# Patient Record
Sex: Male | Born: 1985 | Race: Black or African American | Hispanic: No | Marital: Single | Smoking: Never smoker
Health system: Southern US, Community
[De-identification: ages and names within clinical notes are randomized; demographics above are authoritative.]

## PROBLEM LIST (undated history)

## (undated) DIAGNOSIS — G43909 Migraine, unspecified, not intractable, without status migrainosus: Secondary | ICD-10-CM

## (undated) DIAGNOSIS — F32A Depression, unspecified: Secondary | ICD-10-CM

## (undated) HISTORY — DX: Depression, unspecified: F32.A

---

## 2012-08-15 DIAGNOSIS — L663 Perifolliculitis capitis abscedens: Secondary | ICD-10-CM | POA: Insufficient documentation

## 2015-09-23 ENCOUNTER — Other Ambulatory Visit: Payer: Self-pay | Admitting: Family Medicine

## 2015-09-23 ENCOUNTER — Ambulatory Visit
Admission: RE | Admit: 2015-09-23 | Discharge: 2015-09-23 | Disposition: A | Discharge status: Home / Self Care | Payer: No Typology Code available for payment source | Source: Ambulatory Visit | Attending: Family Medicine | Admitting: Family Medicine

## 2015-09-23 DIAGNOSIS — Z021 Encounter for pre-employment examination: Secondary | ICD-10-CM

## 2017-10-19 ENCOUNTER — Emergency Department (HOSPITAL_BASED_OUTPATIENT_CLINIC_OR_DEPARTMENT_OTHER): Payer: 59

## 2017-10-19 ENCOUNTER — Other Ambulatory Visit: Payer: Self-pay

## 2017-10-19 ENCOUNTER — Emergency Department (HOSPITAL_BASED_OUTPATIENT_CLINIC_OR_DEPARTMENT_OTHER)
Admission: EM | Admit: 2017-10-19 | Discharge: 2017-10-19 | Disposition: A | Discharge status: Home / Self Care | Payer: 59 | Attending: Emergency Medicine | Admitting: Emergency Medicine

## 2017-10-19 ENCOUNTER — Encounter (HOSPITAL_BASED_OUTPATIENT_CLINIC_OR_DEPARTMENT_OTHER): Payer: Self-pay | Admitting: *Deleted

## 2017-10-19 DIAGNOSIS — I10 Essential (primary) hypertension: Secondary | ICD-10-CM | POA: Insufficient documentation

## 2017-10-19 DIAGNOSIS — M25512 Pain in left shoulder: Secondary | ICD-10-CM | POA: Diagnosis not present

## 2017-10-19 DIAGNOSIS — R202 Paresthesia of skin: Secondary | ICD-10-CM | POA: Insufficient documentation

## 2017-10-19 DIAGNOSIS — R5383 Other fatigue: Secondary | ICD-10-CM | POA: Diagnosis not present

## 2017-10-19 DIAGNOSIS — M542 Cervicalgia: Secondary | ICD-10-CM | POA: Insufficient documentation

## 2017-10-19 DIAGNOSIS — R079 Chest pain, unspecified: Secondary | ICD-10-CM | POA: Diagnosis present

## 2017-10-19 HISTORY — DX: Migraine, unspecified, not intractable, without status migrainosus: G43.909

## 2017-10-19 LAB — CBC WITH DIFFERENTIAL/PLATELET
BASOS PCT: 0 %
Basophils Absolute: 0 10*3/uL (ref 0.0–0.1)
Eosinophils Absolute: 0.1 10*3/uL (ref 0.0–0.7)
Eosinophils Relative: 1 %
HEMATOCRIT: 42.1 % (ref 39.0–52.0)
Hemoglobin: 14.5 g/dL (ref 13.0–17.0)
Lymphocytes Relative: 46 %
Lymphs Abs: 2.9 10*3/uL (ref 0.7–4.0)
MCH: 26.6 pg (ref 26.0–34.0)
MCHC: 34.4 g/dL (ref 30.0–36.0)
MCV: 77.1 fL — ABNORMAL LOW (ref 78.0–100.0)
MONO ABS: 0.6 10*3/uL (ref 0.1–1.0)
MONOS PCT: 9 %
NEUTROS ABS: 2.9 10*3/uL (ref 1.7–7.7)
Neutrophils Relative %: 44 %
Platelets: 249 10*3/uL (ref 150–400)
RBC: 5.46 MIL/uL (ref 4.22–5.81)
RDW: 14.8 % (ref 11.5–15.5)
WBC: 6.5 10*3/uL (ref 4.0–10.5)

## 2017-10-19 LAB — BASIC METABOLIC PANEL
ANION GAP: 8 (ref 5–15)
BUN: 9 mg/dL (ref 6–20)
CALCIUM: 9 mg/dL (ref 8.9–10.3)
CO2: 27 mmol/L (ref 22–32)
CREATININE: 1.01 mg/dL (ref 0.61–1.24)
Chloride: 102 mmol/L (ref 101–111)
GFR calc Af Amer: >60 mL/min (ref >60)
GFR calc non Af Amer: >60 mL/min (ref >60)
GLUCOSE: 90 mg/dL (ref 65–99)
Potassium: 3.7 mmol/L (ref 3.5–5.1)
Sodium: 137 mmol/L (ref 135–145)

## 2017-10-19 LAB — TROPONIN I: Troponin I: <0.03 ng/mL (ref <0.03)

## 2017-10-19 MED ORDER — IBUPROFEN 600 MG PO TABS: 600.0000 mg | ORAL_TABLET | Freq: Four times a day (QID) | ORAL | 0 refills | Status: DC | PRN

## 2017-10-19 MED ORDER — KETOROLAC TROMETHAMINE 30 MG/ML IJ SOLN
30.0000 mg | Freq: Once | INTRAMUSCULAR | Status: AC
  Administered 2017-10-19: 30 mg via INTRAVENOUS
  Filled 2017-10-19: qty 1

## 2017-10-19 MED ORDER — GI COCKTAIL ~~LOC~~
30.0000 mL | Freq: Once | ORAL | Status: AC
  Administered 2017-10-19: 30 mL via ORAL
  Filled 2017-10-19: qty 30

## 2017-10-19 NOTE — Discharge Instructions (Signed)
Your workup in the emergency department today did not reveal a concerning cause of your symptoms.  We advise close follow-up with your primary care doctor for recheck of your symptoms as well as reevaluation of your blood pressure.  We recommend the use of ibuprofen as prescribed for ongoing symptom management.  Return to the emergency department as needed for new or concerning symptoms.

## 2017-10-19 NOTE — ED Triage Notes (Signed)
Attempted to call the patient to get EKG  - patient is in the waiting room

## 2017-10-19 NOTE — ED Triage Notes (Signed)
This morning pt woke up with pain in his back. Pain went into shoulders. Now pain is in the center of chest and having tingling in left side of face and having a upset stomach.

## 2017-10-19 NOTE — ED Provider Notes (Signed)
[facility] EMERGENCY DEPARTMENT Provider Note   CSN: [id number] Arrival date & time: 10/19/17  1813    History   Chief Complaint Chief Complaint  Patient presents with  . Chest Pain    HPI Matthew Daniels is a 32 y.o. male.   32 year old male with a history of migraine headaches presents to the emergency department for evaluation of chest pain.  He states that he noticed a soreness in his central chest shortly after waking this morning.  He has noted radiation of his pain to the left side of his neck as well as his left shoulder.  This is aggravated with certain movements, not necessarily exacerbated by ambulation.  He has noted a mild tingling sensation to the left side of his face as well as increasing generalized fatigue.  He states that his stomach felt "a little funny" prior to coming in today.  He has not had any vomiting or diarrhea, but does have a history of reflux.  No associated fevers, diaphoresis, leg swelling.  He denies a known history of hypertension.  He denies a smoking history or family history of ACS.  No recent surgeries or hospitalizations.  No medications taken prior to arrival for symptoms.      Past Medical History:  Diagnosis Date  . Migraine     There are no active problems to display for this patient.   History reviewed. No pertinent surgical history.     Home Medications    Prior to Admission medications   Medication Sig Start Date End Date Taking? Authorizing Provider  SUMAtriptan (IMITREX) 50 MG tablet Take 50 mg by mouth every 2 (two) hours as needed for migraine. May repeat in 2 hours if headache persists or recurs.   Yes [provider]  ibuprofen (ADVIL,MOTRIN) 600 MG tablet Take 1 tablet (600 mg total) by mouth every 6 (six) hours as needed. 10/19/17   Antony Madura, PA-C    Family History History reviewed. No pertinent family history.  Social History Social History   Tobacco Use  . Smoking status: Never Smoker    . Smokeless tobacco: Never Used  Substance Use Topics  . Alcohol use: No    Frequency: Never  . Drug use: No     Allergies   Patient has no known allergies.   Review of Systems Review of Systems Ten systems reviewed and are negative for acute change, except as noted in the HPI.    Physical Exam Updated Vital Signs BP (!) 157/109   Pulse 72   Temp 98.4 F (36.9 C) (Oral)   Resp 18   Ht 6\' 3"  (1.905 m)   Wt 122.5 kg (270 lb)   SpO2 98%   BMI 33.75 kg/m   Physical Exam  Constitutional: He is oriented to person, place, and time. He appears well-developed and well-nourished. No distress.  Nontoxic appearing and in NAD  HENT:  Head: Normocephalic and atraumatic.  Eyes: Conjunctivae and EOM are normal. No scleral icterus.  Neck: Normal range of motion.  No carotid bruits appreciated bilaterally  Cardiovascular: Normal rate, regular rhythm and intact distal pulses.  Pulmonary/Chest: Effort normal. No stridor. No respiratory distress. He has no wheezes. He has no rales.  Lungs CTAB. No reproducible TTP to chest wall.  Abdominal: Soft. He exhibits no distension. There is no tenderness. There is no guarding.  No abdominal TTP. Soft, obese.  Musculoskeletal: Normal range of motion.  No lower extremity edema.  Neurological: He is alert and oriented  to person, place, and time.  Skin: Skin is warm and dry. No rash noted. He is not diaphoretic. No erythema. No pallor.  Psychiatric: He has a normal mood and affect. His behavior is normal.  Nursing note and vitals reviewed.    ED Treatments / Results  Labs (all labs ordered are listed, but only abnormal results are displayed) Labs Reviewed  CBC WITH DIFFERENTIAL/PLATELET - Abnormal; Notable for the following components:      Result Value   MCV 77.1 (*)    All other components within normal limits  TROPONIN I  BASIC METABOLIC PANEL    EKG  EKG Interpretation  Date/Time:  Friday October 19 2017 18:22:30 EDT Ventricular  Rate:  76 PR Interval:    QRS Duration: 88 QT Interval:  379 QTC Calculation: 427 R Axis:   53 Text Interpretation:  Sinus rhythm Baseline wander in lead(s) II III aVR aVL aVF V1 V3 V4 V5 V6 No previous tracing Confirmed by Gwyneth SproutPlunkett, Whitney ([id number]) on 10/19/2017 6:30:06 PM       Radiology No results found.  Procedures Procedures (including critical care time)  Medications Ordered in ED Medications  gi cocktail (Maalox,Lidocaine,Donnatal) (30 mLs Oral Given 10/19/17 1858)  ketorolac (TORADOL) 30 MG/ML injection 30 mg (30 mg Intravenous Given 10/19/17 1936)     Initial Impression / Assessment and Plan / ED Course  I have reviewed the triage vital signs and the nursing notes.  Pertinent labs & imaging results that were available during my care of the patient were reviewed by me and considered in my medical decision making (see chart for details).     6:50 PM Patient presenting for complaints of chest pain.  Pain has started to radiate to his left shoulder as well as the left side of his jaw and face.  It is worse with movement, especially when leaning forward.  No associated fevers, but he does report recent cold-like symptoms.  No medications taken prior to arrival.  Physical exam reassuring.  Will obtain labs and chest x-ray.  7:32 PM Patient reports improvement in his stomach discomfort following GI cocktail.  He continues to notice a central chest discomfort.  He continues to report this becoming worse when leaning forward or with any movement.  This seems less characteristic of pericarditis and EKG does not suggest pericarditis at this time.  Troponin is negative and EKG without signs of acute ischemia.  Doubt ACS or myocarditis.  Heart score is 1-2 depending upon level of suspicion; both c/w low risk of acute coronary event.  Pulmonary embolus considered; however, patient is PERC negative and symptoms atypical for PE.  Will order Toradol for pain management.  Pending chest x-ray  currently.  MSK etiology favored at this time.  7:53 PM BP noted to be elevated to 152/113 at OP visit on 10/16/17. Also at 2 prior ED visits this month. Will have BP rechecked by PCP prior to initiation of medication. Anticipate discharge on 600mg  ibuprofen every 6 hours with instruction for close primary care follow up if CXR reassuring. Patient signed out to [facility] [employer] [facility] Khatri, PA-C at change of shift who will follow up on imaging.   Final Clinical Impressions(s) / ED Diagnoses   Final diagnoses:  Chest pain, unspecified type  Hypertension not at goal    ED Discharge Orders        Ordered    ibuprofen (ADVIL,MOTRIN) 600 MG tablet  Every 6 hours PRN     10/19/17 1956  Antony Madura, PA-C 10/19/17 2007    Gwyneth Sprout, MD 10/21/17 2016

## 2017-10-19 NOTE — ED Notes (Signed)
Pt verbalizes understanding of d/c instructions and denies any further needs at this time. 

## 2017-10-26 IMAGING — CR DG CHEST 1V
1 series · 1 of 1 positions shown · non-contrast
Comparison: None in PACs.

CLINICAL DATA: Pre-employment physical, asymptomatic, nonsmoker.

EXAM:
CHEST 1 VIEW

[w chest pa]
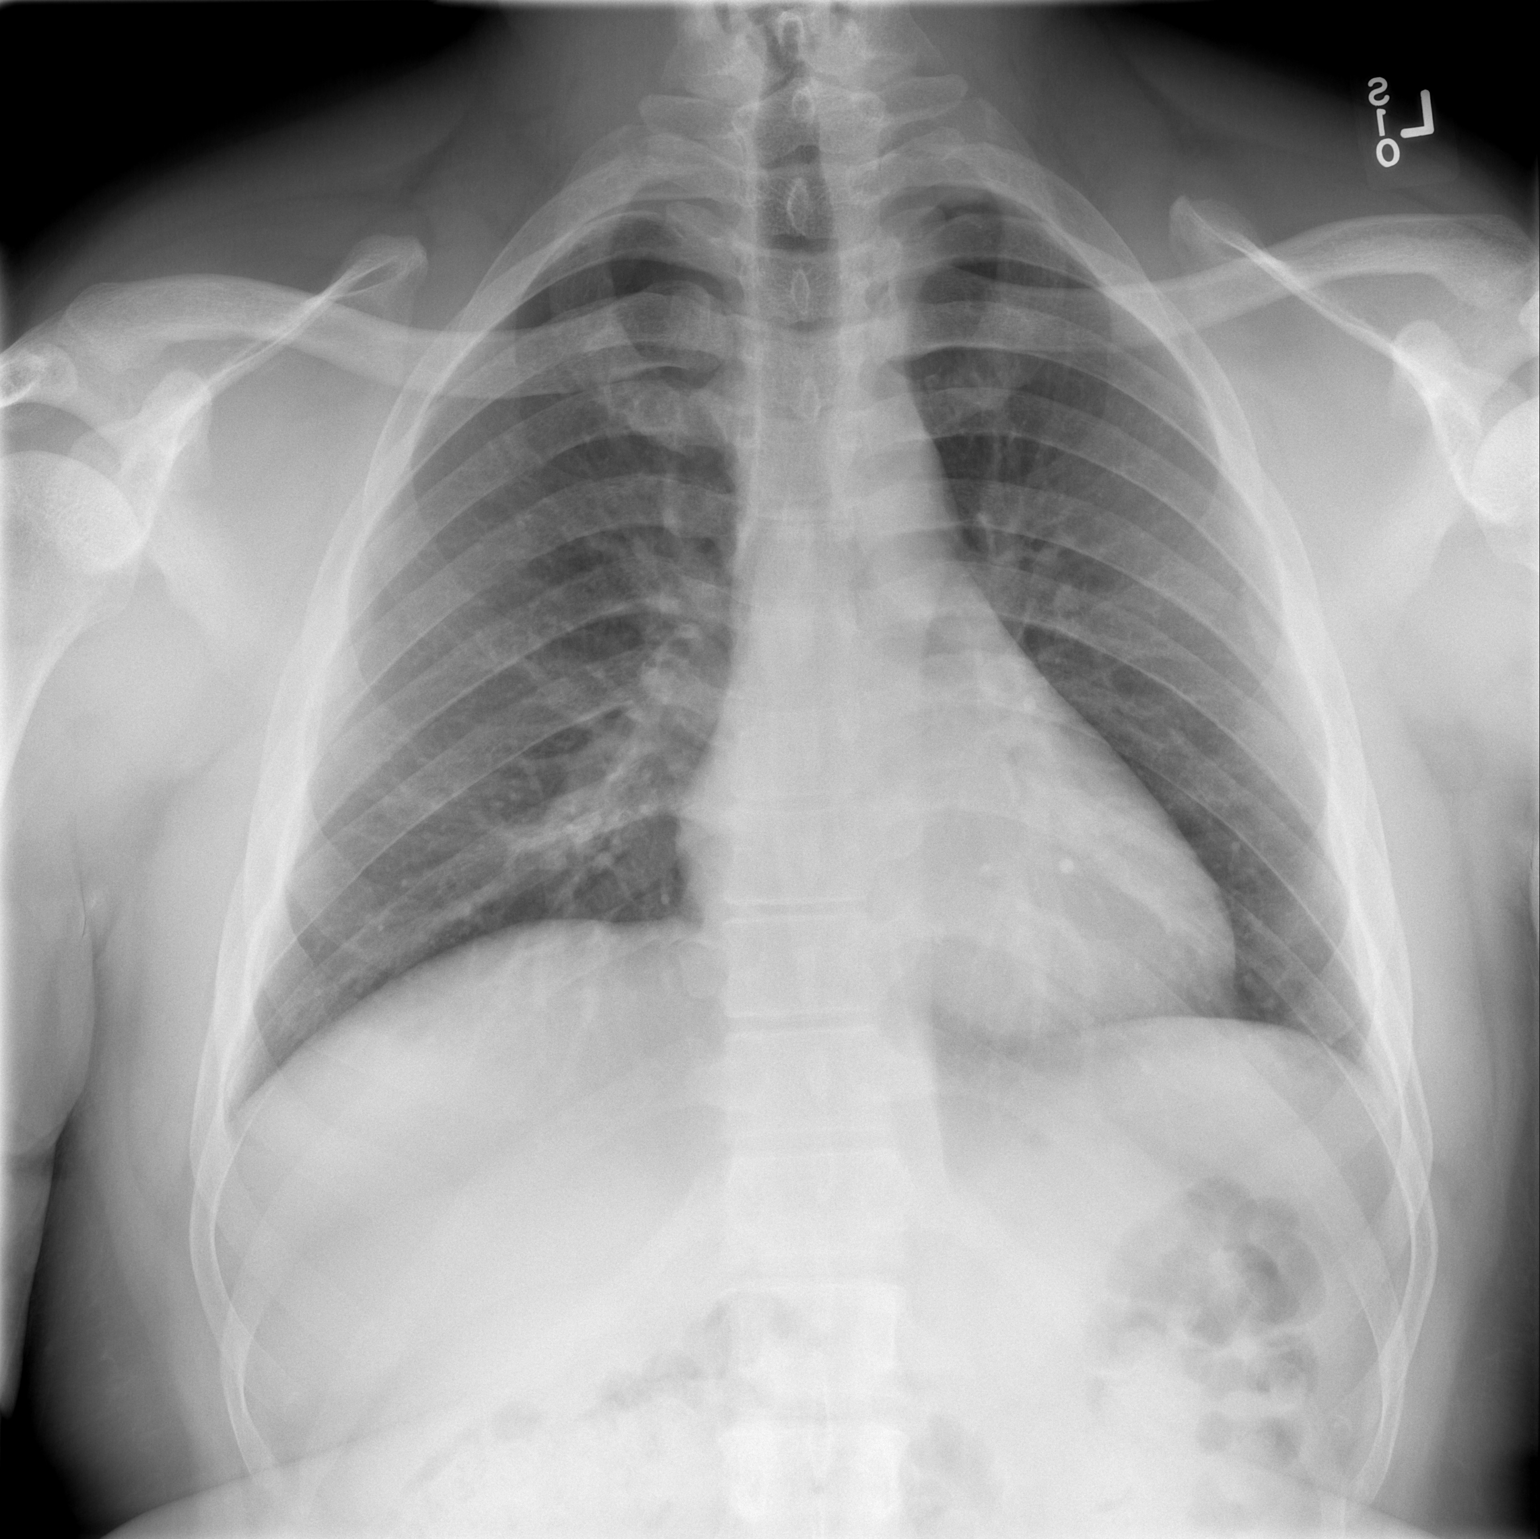

[1 of 1 positions shown; findings below may reference images not displayed]

FINDINGS: The lungs are adequately inflated. There is no focal infiltrate.
There is no pleural effusion. The heart and pulmonary vascularity
are normal. The mediastinum is normal in width. The trachea is
midline. The bony thorax is unremarkable.
IMPRESSION: There is no active cardiopulmonary disease.

## 2017-10-31 DIAGNOSIS — F321 Major depressive disorder, single episode, moderate: Secondary | ICD-10-CM | POA: Insufficient documentation

## 2017-10-31 DIAGNOSIS — G43709 Chronic migraine without aura, not intractable, without status migrainosus: Secondary | ICD-10-CM | POA: Insufficient documentation

## 2019-01-23 ENCOUNTER — Other Ambulatory Visit: Payer: Self-pay

## 2019-01-23 ENCOUNTER — Encounter (HOSPITAL_BASED_OUTPATIENT_CLINIC_OR_DEPARTMENT_OTHER): Payer: Self-pay | Admitting: Emergency Medicine

## 2019-01-23 ENCOUNTER — Emergency Department (HOSPITAL_BASED_OUTPATIENT_CLINIC_OR_DEPARTMENT_OTHER): Payer: Self-pay

## 2019-01-23 ENCOUNTER — Emergency Department (HOSPITAL_BASED_OUTPATIENT_CLINIC_OR_DEPARTMENT_OTHER)
Admission: EM | Admit: 2019-01-23 | Discharge: 2019-01-23 | Disposition: A | Discharge status: Home / Self Care | Payer: Self-pay | Attending: Emergency Medicine | Admitting: Emergency Medicine

## 2019-01-23 DIAGNOSIS — Z20828 Contact with and (suspected) exposure to other viral communicable diseases: Secondary | ICD-10-CM | POA: Insufficient documentation

## 2019-01-23 DIAGNOSIS — R0789 Other chest pain: Secondary | ICD-10-CM | POA: Insufficient documentation

## 2019-01-23 DIAGNOSIS — Z20822 Contact with and (suspected) exposure to covid-19: Secondary | ICD-10-CM

## 2019-01-23 MED ORDER — BENZONATATE 100 MG PO CAPS
100.0000 mg | ORAL_CAPSULE | Freq: Three times a day (TID) | ORAL | 0 refills | Status: DC
Start: 2019-01-23 — End: 2020-02-18

## 2019-01-23 NOTE — ED Provider Notes (Signed)
[facility] EMERGENCY DEPARTMENT Provider Note   CSN: [id number] Arrival date & time: 01/23/19  0758    History   Chief Complaint Chief Complaint  Patient presents with  . Chest Pain    HPI Matthew Daniels is a 33 y.o. male.     The history is provided by the patient and medical records. No language interpreter was used.  Chest Pain  Matthew Daniels is a 33 y.o. male who presents to the Emergency Department complaining of chest pain. He presents to the emergency department complaining of severe chest pain that began about four days ago. Pain is described as a muscle soreness in his throughout his central chest, sides, back and neck. Pain is worse with movement. He states it feels like something is sitting on his chest. He has associated cough productive of yellow sputum. He also has mild intermittent shortness of breath. No reports of fevers but he does feel quite cold at times and thinks he may be running a temperature at home. He has not lost his sense of taste or smell. He denies any leg swelling or pain. He does have some mild diarrhea and nausea. Denies vomiting. He has been exposed to someone that is been exposed to coronavirus a week ago. He has a history of migraine headaches, no additional medical problems. He denies any tobacco, alcohol, drug use. Past Medical History:  Diagnosis Date  . Migraine     There are no active problems to display for this patient.   History reviewed. No pertinent surgical history.      Home Medications    Prior to Admission medications   Medication Sig Start Date End Date Taking? Authorizing Provider  benzonatate (TESSALON) 100 MG capsule Take 1 capsule (100 mg total) by mouth every 8 (eight) hours. 01/23/19   Tilden Fossaees, Tyresha Fede, MD  ibuprofen (ADVIL,MOTRIN) 600 MG tablet Take 1 tablet (600 mg total) by mouth every 6 (six) hours as needed. 10/19/17   Antony MaduraHumes, Kelly, PA-C  SUMAtriptan (IMITREX) 50 MG tablet Take 50 mg by mouth every 2 (two)  hours as needed for migraine. May repeat in 2 hours if headache persists or recurs.    [provider]    Family History No family history on file.  Social History Social History   Tobacco Use  . Smoking status: Never Smoker  . Smokeless tobacco: Never Used  Substance Use Topics  . Alcohol use: No    Frequency: Never  . Drug use: No     Allergies   Patient has no known allergies.   Review of Systems Review of Systems  Cardiovascular: Positive for chest pain.  All other systems reviewed and are negative.    Physical Exam Updated Vital Signs BP (!) 154/111 (BP Location: Right Arm)   Pulse 75   Temp 98 F (36.7 C) (Oral)   Resp 20   Ht 6\' 3"  (1.905 m)   Wt 106.6 kg   SpO2 100%   BMI 29.37 kg/m   Physical Exam Vitals signs and nursing note reviewed.  Constitutional:      Appearance: He is well-developed.  HENT:     Head: Normocephalic and atraumatic.  Cardiovascular:     Rate and Rhythm: Normal rate and regular rhythm.     Heart sounds: No murmur.  Pulmonary:     Effort: Pulmonary effort is normal. No respiratory distress.     Breath sounds: Normal breath sounds.  Chest:     Chest wall: Tenderness present.  Abdominal:     Palpations: Abdomen is soft.     Tenderness: There is no guarding or rebound.     Comments: Mild generalized abdominal tenderness  Musculoskeletal:        General: No swelling or tenderness.  Skin:    General: Skin is warm and dry.  Neurological:     Mental Status: He is alert and oriented to person, place, and time.  Psychiatric:        Mood and Affect: Mood normal.        Behavior: Behavior normal.      ED Treatments / Results  Labs (all labs ordered are listed, but only abnormal results are displayed) Labs Reviewed  NOVEL CORONAVIRUS, NAA (HOSPITAL ORDER, SEND-OUT TO REF LAB)    EKG EKG Interpretation  Date/Time:  Thursday January 23 2019 08:06:07 EDT Ventricular Rate:  76 PR Interval:    QRS Duration: 92  QT Interval:  380 QTC Calculation: 428 R Axis:   50 Text Interpretation:  Sinus rhythm Probable left atrial enlargement Baseline wander in lead(s) V4 V5 No significant change since last tracing Confirmed by Quintella Reichert [phone]) on 01/23/2019 8:09:02 AM   Radiology Dg Chest 2 View  Result Date: 01/23/2019 CLINICAL DATA:  Chest pain, shortness of breath and nausea for 4 days. EXAM: CHEST - 2 VIEW COMPARISON:  Chest x-ray dated 10/19/2017. FINDINGS: Cardiomediastinal silhouette is within normal limits in size and configuration. Lungs are clear. Lung volumes are normal. No evidence of pneumonia. No pleural effusion. No pneumothorax seen. Osseous structures about the chest are unremarkable. IMPRESSION: No active cardiopulmonary disease. No evidence of pneumonia or pulmonary edema. Electronically Signed   By: Franki Cabot M.D.   On: 01/23/2019 08:53    Procedures Procedures (including critical care time)  Medications Ordered in ED Medications - No data to display   Initial Impression / Assessment and Plan / ED Course  I have reviewed the triage vital signs and the nursing notes.  Pertinent labs & imaging results that were available during my care of the patient were reviewed by me and considered in my medical decision making (see chart for details).        Patient here for evaluation of four days of chest pain, shortness of breath and cough. He is non-toxic appearing on evaluation. EKG without acute ischemic changes. Pain is reproducible on examination. Current presentation is not consistent with ACS, dissection, PE, pneumonia. He has had exposure to coronavirus. Discussed with patient concern for possible coronavirus infection. Counseled patient on oral fluid hydration and symptom management with ibuprofen and acetaminophen, over-the-counter per label instructions. Will provide Tessalon pearls for his cough. Discussed importance of return precautions, home isolation.  Matthew Daniels was  evaluated in Emergency Department on 01/23/2019 for the symptoms described in the history of present illness. He was evaluated in the context of the global COVID-19 pandemic, which necessitated consideration that the patient might be at risk for infection with the SARS-CoV-2 virus that causes COVID-19. Institutional protocols and algorithms that pertain to the evaluation of patients at risk for COVID-19 are in a state of rapid change based on information released by regulatory bodies including the CDC and federal and state organizations. These policies and algorithms were followed during the patient's care in the ED.   Final Clinical Impressions(s) / ED Diagnoses   Final diagnoses:  Atypical chest pain  Suspected Covid-19 Virus Infection    ED Discharge Orders         Ordered  benzonatate (TESSALON) 100 MG capsule  Every 8 hours     01/23/19 0910           Tilden Fossaees, Cace Osorto, MD 01/23/19 [phone]

## 2019-01-23 NOTE — ED Notes (Signed)
Pt verbalized understanding of dc instructions.

## 2019-01-23 NOTE — ED Triage Notes (Signed)
Centralized chest pain with SOB x 4 days. Endorses cough.

## 2019-01-24 LAB — NOVEL CORONAVIRUS, NAA (HOSP ORDER, SEND-OUT TO REF LAB; TAT 18-24 HRS): SARS-CoV-2, NAA: NOT DETECTED

## 2019-01-30 ENCOUNTER — Emergency Department (HOSPITAL_BASED_OUTPATIENT_CLINIC_OR_DEPARTMENT_OTHER)
Admission: EM | Admit: 2019-01-30 | Discharge: 2019-01-30 | Disposition: A | Discharge status: Home / Self Care | Payer: Self-pay | Attending: Emergency Medicine | Admitting: Emergency Medicine

## 2019-01-30 ENCOUNTER — Other Ambulatory Visit: Payer: Self-pay

## 2019-01-30 ENCOUNTER — Encounter (HOSPITAL_BASED_OUTPATIENT_CLINIC_OR_DEPARTMENT_OTHER): Payer: Self-pay | Admitting: Emergency Medicine

## 2019-01-30 ENCOUNTER — Emergency Department (HOSPITAL_BASED_OUTPATIENT_CLINIC_OR_DEPARTMENT_OTHER): Payer: Self-pay

## 2019-01-30 DIAGNOSIS — Z20822 Contact with and (suspected) exposure to covid-19: Secondary | ICD-10-CM

## 2019-01-30 DIAGNOSIS — R05 Cough: Secondary | ICD-10-CM | POA: Insufficient documentation

## 2019-01-30 DIAGNOSIS — J069 Acute upper respiratory infection, unspecified: Secondary | ICD-10-CM | POA: Insufficient documentation

## 2019-01-30 LAB — CBC WITH DIFFERENTIAL/PLATELET
Abs Immature Granulocytes: 0.01 10*3/uL (ref 0.00–0.07)
Basophils Absolute: 0 10*3/uL (ref 0.0–0.1)
Basophils Relative: 0 %
Eosinophils Absolute: 0.1 10*3/uL (ref 0.0–0.5)
Eosinophils Relative: 1 %
HCT: 44.6 % (ref 39.0–52.0)
Hemoglobin: 14.2 g/dL (ref 13.0–17.0)
Immature Granulocytes: 0 %
Lymphocytes Relative: 35 %
Lymphs Abs: 2.2 10*3/uL (ref 0.7–4.0)
MCH: 25.9 pg — ABNORMAL LOW (ref 26.0–34.0)
MCHC: 31.8 g/dL (ref 30.0–36.0)
MCV: 81.4 fL (ref 80.0–100.0)
Monocytes Absolute: 0.5 10*3/uL (ref 0.1–1.0)
Monocytes Relative: 8 %
Neutro Abs: 3.6 10*3/uL (ref 1.7–7.7)
Neutrophils Relative %: 56 %
Platelets: 303 10*3/uL (ref 150–400)
RBC: 5.48 MIL/uL (ref 4.22–5.81)
RDW: 14.5 % (ref 11.5–15.5)
WBC: 6.4 10*3/uL (ref 4.0–10.5)
nRBC: 0 % (ref 0.0–0.2)

## 2019-01-30 LAB — COMPREHENSIVE METABOLIC PANEL
ALT: 14 U/L (ref 0–44)
AST: 17 U/L (ref 15–41)
Albumin: 4.3 g/dL (ref 3.5–5.0)
Alkaline Phosphatase: 60 U/L (ref 38–126)
Anion gap: 9 (ref 5–15)
BUN: 10 mg/dL (ref 6–20)
CO2: 27 mmol/L (ref 22–32)
Calcium: 9.1 mg/dL (ref 8.9–10.3)
Chloride: 104 mmol/L (ref 98–111)
Creatinine, Ser: 1.05 mg/dL (ref 0.61–1.24)
GFR calc Af Amer: >60 mL/min (ref >60)
GFR calc non Af Amer: >60 mL/min (ref >60)
Glucose, Bld: 94 mg/dL (ref 70–99)
Potassium: 3.8 mmol/L (ref 3.5–5.1)
Sodium: 140 mmol/L (ref 135–145)
Total Bilirubin: 0.4 mg/dL (ref 0.3–1.2)
Total Protein: 7.8 g/dL (ref 6.5–8.1)

## 2019-01-30 MED ORDER — ACETAMINOPHEN 325 MG PO TABS
650.0000 mg | ORAL_TABLET | Freq: Once | ORAL | Status: AC
  Administered 2019-01-30: 16:00:00 650 mg via ORAL
  Filled 2019-01-30: qty 2

## 2019-01-30 MED ORDER — ONDANSETRON 4 MG PO TBDP
4.0000 mg | ORAL_TABLET | Freq: Once | ORAL | Status: AC
  Administered 2019-01-30: 4 mg via ORAL
  Filled 2019-01-30: qty 1

## 2019-01-30 MED ORDER — ONDANSETRON HCL 4 MG PO TABS: 4.0000 mg | ORAL_TABLET | Freq: Four times a day (QID) | ORAL | 0 refills | Status: AC

## 2019-01-30 NOTE — ED Notes (Signed)
Pt on monitor 

## 2019-01-30 NOTE — Discharge Instructions (Addendum)
Your laboratory results were within normal limits today.  Your chest x-ray showed no pneumonia.  I have prescribed Zofran to help with your nausea, please take this as needed.  Experience any shortness of breath, chest pain or worsening symptoms you may return to the emergency department.

## 2019-01-30 NOTE — ED Triage Notes (Signed)
Reports recurrent centralized chest pain, cough and fever x 1 week, was seen and tested for Covid-19 which was negative. Also reports right earache , sore throat, diarrhea. Loss of taste . Alert and oriented  x 4.

## 2019-01-30 NOTE — ED Provider Notes (Addendum)
[facility] EMERGENCY DEPARTMENT Provider Note   CSN: [id number] Arrival date & time: 01/30/19  1432    History   Chief Complaint Chief Complaint  Patient presents with  . Chest Pain  . Cough    HPI Matthew Daniels is a 33 y.o. male.     33 y.o male with a PMH of migraines presents to the ED with a chief complaint body aches, weakness, fatigue, sore throat, chest soreness x 1 week. Patient reports he was seen in the ED for similar complaints states he was tested for Covid 19, results were negative. He also endorses a fever while at home with a  Tmax of 101, patient has been taking tylenol and ibuprofen for his symptoms with some improvement. Patient also endorses non bloody diarrhea and loss of taste.He denies any shortness of breath, chest pain, or cough.   The history is provided by the patient and medical records.    Past Medical History:  Diagnosis Date  . Migraine     There are no active problems to display for this patient.   History reviewed. No pertinent surgical history.      Home Medications    Prior to Admission medications   Medication Sig Start Date End Date Taking? Authorizing Provider  benzonatate (TESSALON) 100 MG capsule Take 1 capsule (100 mg total) by mouth every 8 (eight) hours. 01/23/19   Tilden Fossaees, Elizabeth, MD  ibuprofen (ADVIL,MOTRIN) 600 MG tablet Take 1 tablet (600 mg total) by mouth every 6 (six) hours as needed. 10/19/17   Antony MaduraHumes, Kelly, PA-C  SUMAtriptan (IMITREX) 50 MG tablet Take 50 mg by mouth every 2 (two) hours as needed for migraine. May repeat in 2 hours if headache persists or recurs.    [provider]    Family History No family history on file.  Social History Social History   Tobacco Use  . Smoking status: Never Smoker  . Smokeless tobacco: Never Used  Substance Use Topics  . Alcohol use: No    Frequency: Never  . Drug use: No     Allergies   Patient has no known allergies.   Review of Systems  Review of Systems  Constitutional: Positive for fever. Negative for chills.  HENT: Positive for ear pain, rhinorrhea, sinus pressure and sinus pain. Negative for sore throat.   Eyes: Negative for pain and visual disturbance.  Respiratory: Negative for cough and shortness of breath.   Cardiovascular: Negative for chest pain and palpitations.  Gastrointestinal: Positive for abdominal pain, diarrhea, nausea and vomiting.  Genitourinary: Negative for dysuria and hematuria.  Musculoskeletal: Positive for myalgias. Negative for arthralgias and back pain.  Skin: Negative for color change and rash.  Neurological: Negative for seizures and syncope.  All other systems reviewed and are negative.    Physical Exam Updated Vital Signs BP (!) 143/110   Pulse 73   Temp 98.2 F (36.8 C) (Oral)   Resp 16   Ht 6\' 3"  (1.905 m)   Wt 106.6 kg   SpO2 100%   BMI 29.37 kg/m   Physical Exam Vitals signs and nursing note reviewed.  Constitutional:      Appearance: He is well-developed.     Comments: Non ill appearing.   HENT:     Head: Normocephalic and atraumatic.     Right Ear: Hearing and ear canal normal. Tympanic membrane is erythematous. Tympanic membrane is not perforated or bulging.     Left Ear: Hearing and ear canal normal. Tympanic membrane  is erythematous. Tympanic membrane is not perforated or bulging.     Nose:     Right Sinus: Frontal sinus tenderness present.     Left Sinus: Frontal sinus tenderness present.     Mouth/Throat:     Mouth: Mucous membranes are moist.     Comments: Oropharynx appears erythematous, no exudate or tonsillar abscess.  Eyes:     General: No scleral icterus.    Pupils: Pupils are equal, round, and reactive to light.  Neck:     Musculoskeletal: Normal range of motion.  Cardiovascular:     Heart sounds: Normal heart sounds.  Pulmonary:     Effort: Pulmonary effort is normal.     Breath sounds: Normal breath sounds. No wheezing.     Comments: No  rhonchi, rales, wheezing.  Chest:     Chest wall: No tenderness.  Abdominal:     General: Bowel sounds are normal. There is no distension.     Palpations: Abdomen is soft.     Tenderness: There is no abdominal tenderness. There is no right CVA tenderness or left CVA tenderness.     Comments: No tenderness to palpation along abdomen, soft, normal bowel sounds.   Musculoskeletal:        General: No tenderness or deformity.  Skin:    General: Skin is warm and dry.  Neurological:     Mental Status: He is alert and oriented to person, place, and time.      ED Treatments / Results  Labs (all labs ordered are listed, but only abnormal results are displayed) Labs Reviewed  CBC WITH DIFFERENTIAL/PLATELET - Abnormal; Notable for the following components:      Result Value   MCH 25.9 (*)    All other components within normal limits  COMPREHENSIVE METABOLIC PANEL    EKG EKG Interpretation  Date/Time:  Thursday January 30 2019 14:45:02 EDT Ventricular Rate:  72 PR Interval:    QRS Duration: 92 QT Interval:  396 QTC Calculation: 434 R Axis:   67 Text Interpretation:  Sinus rhythm Minimal ST elevation, anterior leads No STEMI. Similar to prior.  Confirmed by Nanda Quinton [phone]) on 01/30/2019 2:52:44 PM   Radiology Dg Chest Portable 1 View  Result Date: 01/30/2019 CLINICAL DATA:  Shortness of breath EXAM: PORTABLE CHEST 1 VIEW COMPARISON:  January 23, 2019 FINDINGS: There is no edema or consolidation. Heart is upper normal in size with pulmonary vascularity normal. No adenopathy. No pneumothorax. No bone lesions. IMPRESSION: No edema or consolidation.  Heart upper normal in size. Electronically Signed   By: Lowella Grip III M.D.   On: 01/30/2019 15:41    Procedures Procedures (including critical care time)  Medications Ordered in ED Medications  ondansetron (ZOFRAN-ODT) disintegrating tablet 4 mg (4 mg Oral Given 01/30/19 1603)  acetaminophen (TYLENOL) tablet 650 mg (650 mg Oral  Given 01/30/19 1603)     Initial Impression / Assessment and Plan / ED Course  I have reviewed the triage vital signs and the nursing notes.  Pertinent labs & imaging results that were available during my care of the patient were reviewed by me and considered in my medical decision making (see chart for details).    Patient with a past medical history of migraines presents to the ED with complaints of fever, sore throat, cough, shortness of breath x1 week.  Was seen in the ED about 5 days ago, noted to have a negative COVID test.  During primary evaluation patient appears nontoxic, appears uncomfortable.  Lungs are clear to auscultation, no swelling noted to his legs.  Denies any previous history of CAD, heart failure.  Reports his symptoms have began to get worse, stating he feels short of breath with ambulation.  Also reports a sore throat, decrease in taste and smell.  Patient has been taking Tylenol for his symptoms, reports a T-max of 101 while at home.  Arrived in the ED afebrile.  Hypertensive on arrival, does have a previous history of this.  C showed no leukocytosis, hemoglobin is within normal limits.  CMP showed no electrolyte abnormality although he reported multiple episodes of diarrhea along with emesis.  Does not look fluid depleted on my evaluation.  LFTs are within normal limits.  X-ray showed no edema, consolidation, no cardiomegaly.  Patient was provided with Zofran along with Tylenol while in the ED, high suspicion the patient's likely suffering from COVID-19 although his test was negative, explained to him that he might have gotten tested too early.  Patient reports most of his symptoms that bother him are his nausea.  Low suspicion for any ACS as he denies any chest pain but endorses shortness of breath, he is however satting at 100% on room air.  No respiratory distress.  Low suspicion for any heart failure, no changes consistent with this during evaluation of physical exam or  chest x-ray.  Will provide patient with a prescription for Zofran along with symptom monitoring while at home.  He is advised to quarantine for the next 14 days.  Patient understands and agrees with management at this time.  Return precautions provided.   Matthew Daniels was evaluated in Emergency Department on 01/30/2019 for the symptoms described in the history of present illness. He was evaluated in the context of the global COVID-19 pandemic, which necessitated consideration that the patient might be at risk for infection with the SARS-CoV-2 virus that causes COVID-19. Institutional protocols and algorithms that pertain to the evaluation of patients at risk for COVID-19 are in a state of rapid change based on information released by regulatory bodies including the CDC and federal and state organizations. These policies and algorithms were followed during the patient's care in the ED.  Portions of this note were generated with Scientist, clinical (histocompatibility and immunogenetics)Dragon dictation software. Dictation errors may occur despite best attempts at proofreading.    Final Clinical Impressions(s) / ED Diagnoses   Final diagnoses:  Viral URI with cough  Suspected Covid-19 Virus Infection    ED Discharge Orders    None       Claude MangesSoto, Jordana Dugue, PA-C 01/30/19 [address]1706    Jamil Castillo, PA-C 01/30/19 1707    Maia PlanLong, Joshua G, MD 02/01/19 [phone]

## 2020-02-18 ENCOUNTER — Other Ambulatory Visit: Payer: Self-pay

## 2020-02-18 ENCOUNTER — Encounter (HOSPITAL_BASED_OUTPATIENT_CLINIC_OR_DEPARTMENT_OTHER): Payer: Self-pay | Admitting: Emergency Medicine

## 2020-02-18 ENCOUNTER — Emergency Department (HOSPITAL_BASED_OUTPATIENT_CLINIC_OR_DEPARTMENT_OTHER): Payer: BC Managed Care – PPO

## 2020-02-18 ENCOUNTER — Emergency Department (HOSPITAL_BASED_OUTPATIENT_CLINIC_OR_DEPARTMENT_OTHER)
Admission: EM | Admit: 2020-02-18 | Discharge: 2020-02-18 | Disposition: A | Discharge status: Home / Self Care | Payer: BC Managed Care – PPO | Attending: Emergency Medicine | Admitting: Emergency Medicine

## 2020-02-18 DIAGNOSIS — R0789 Other chest pain: Secondary | ICD-10-CM | POA: Insufficient documentation

## 2020-02-18 DIAGNOSIS — Z20822 Contact with and (suspected) exposure to covid-19: Secondary | ICD-10-CM | POA: Insufficient documentation

## 2020-02-18 DIAGNOSIS — R079 Chest pain, unspecified: Secondary | ICD-10-CM

## 2020-02-18 LAB — TROPONIN I (HIGH SENSITIVITY)
Troponin I (High Sensitivity): 2 ng/L (ref <18)
Troponin I (High Sensitivity): 2 ng/L (ref <18)

## 2020-02-18 LAB — CBC WITH DIFFERENTIAL/PLATELET
Abs Immature Granulocytes: 0.01 10*3/uL (ref 0.00–0.07)
Basophils Absolute: 0 10*3/uL (ref 0.0–0.1)
Basophils Relative: 0 %
Eosinophils Absolute: 0.1 10*3/uL (ref 0.0–0.5)
Eosinophils Relative: 1 %
HCT: 44.5 % (ref 39.0–52.0)
Hemoglobin: 14.3 g/dL (ref 13.0–17.0)
Immature Granulocytes: 0 %
Lymphocytes Relative: 34 %
Lymphs Abs: 1.8 10*3/uL (ref 0.7–4.0)
MCH: 25.8 pg — ABNORMAL LOW (ref 26.0–34.0)
MCHC: 32.1 g/dL (ref 30.0–36.0)
MCV: 80.2 fL (ref 80.0–100.0)
Monocytes Absolute: 0.5 10*3/uL (ref 0.1–1.0)
Monocytes Relative: 9 %
Neutro Abs: 3 10*3/uL (ref 1.7–7.7)
Neutrophils Relative %: 56 %
Platelets: 237 10*3/uL (ref 150–400)
RBC: 5.55 MIL/uL (ref 4.22–5.81)
RDW: 14.6 % (ref 11.5–15.5)
WBC: 5.3 10*3/uL (ref 4.0–10.5)
nRBC: 0 % (ref 0.0–0.2)

## 2020-02-18 LAB — COMPREHENSIVE METABOLIC PANEL
ALT: 26 U/L (ref 0–44)
AST: 19 U/L (ref 15–41)
Albumin: 4.3 g/dL (ref 3.5–5.0)
Alkaline Phosphatase: 49 U/L (ref 38–126)
Anion gap: 8 (ref 5–15)
BUN: 10 mg/dL (ref 6–20)
CO2: 27 mmol/L (ref 22–32)
Calcium: 9 mg/dL (ref 8.9–10.3)
Chloride: 103 mmol/L (ref 98–111)
Creatinine, Ser: 1.03 mg/dL (ref 0.61–1.24)
GFR calc Af Amer: >60 mL/min (ref >60)
GFR calc non Af Amer: >60 mL/min (ref >60)
Glucose, Bld: 106 mg/dL — ABNORMAL HIGH (ref 70–99)
Potassium: 4 mmol/L (ref 3.5–5.1)
Sodium: 138 mmol/L (ref 135–145)
Total Bilirubin: 0.4 mg/dL (ref 0.3–1.2)
Total Protein: 7.7 g/dL (ref 6.5–8.1)

## 2020-02-18 LAB — D-DIMER, QUANTITATIVE: D-Dimer, Quant: <0.27 ug/mL-FEU (ref 0.00–0.50)

## 2020-02-18 LAB — SARS CORONAVIRUS 2 BY RT PCR (HOSPITAL ORDER, PERFORMED IN ~~LOC~~ HOSPITAL LAB): SARS Coronavirus 2: NEGATIVE

## 2020-02-18 MED ORDER — KETOROLAC TROMETHAMINE 60 MG/2ML IM SOLN
60.0000 mg | Freq: Once | INTRAMUSCULAR | Status: DC
  Filled 2020-02-18: qty 2

## 2020-02-18 MED ORDER — KETOROLAC TROMETHAMINE 30 MG/ML IJ SOLN
30.0000 mg | Freq: Once | INTRAMUSCULAR | Status: AC
  Administered 2020-02-18: 30 mg via INTRAVENOUS
  Filled 2020-02-18: qty 1

## 2020-02-18 MED ORDER — METHOCARBAMOL 500 MG PO TABS
500.0000 mg | ORAL_TABLET | Freq: Two times a day (BID) | ORAL | 0 refills | Status: DC
Start: 2020-02-18 — End: 2024-03-14

## 2020-02-18 NOTE — Discharge Instructions (Addendum)
Your work-up today was overall reassuring.  Your blood test did not show evidence of a heart attack or a blood clot in your lungs.  Please make sure to follow-up with your primary care doctor about your high blood pressure, and for your symptoms for possible further work-up.  I will prescribe a muscle relaxer, please take this medication as night as it does have sleepy side effects.  Do not drink or drive on this medication.  Return to the ER at anytime for worsening or more concerning symptoms.  If you do not have a primary care doctor, please call the phone number in your discharge paperwork in order to establish with 1.

## 2020-02-18 NOTE — ED Provider Notes (Signed)
[facility] EMERGENCY DEPARTMENT Provider Note   CSN: [id number] Arrival date & time: 02/18/20  6761     History Chief Complaint  Patient presents with  . Chest Pain    Matthew Daniels is a 34 y.o. male.  HPI  34 year old male with a history of migraines presents to the ER with chest tightness/pain especially when breathing in on the right side x1 week.  Patient states that he has been taking 800 mg ibuprofen for his symptoms with little relief.  He states that he also has started to have some arthralgias on Monday, pain in his elbow and right wrist.  He also noted that on Monday he started to have some pain that radiated down to his arm, back and up his neck and into his right ear.  He also feels like his right arm has been swollen as of this morning.  He states he has some periodic shortness of breath when walking, chills at night, but no tactile or measurable fevers.  He also states that he has been feeling tired.  He endorses a dry cough, no blood or mucus.  He also endorses some nausea, but no vomiting.  He has a history of migraines, no cardiac history.  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  No abdominal pain, unilateral weakness, numbness or tingling down his legs, diaphoresis.  No known Covid exposures, no history of blood clots, asthma.  Past Medical History:  Diagnosis Date  . Migraine     There are no problems to display for this patient.   History reviewed. No pertinent surgical history.     No family history on file.  Social History   Tobacco Use  . Smoking status: Never Smoker  . Smokeless tobacco: Never Used  Substance Use Topics  . Alcohol use: No  . Drug use: No    Home Medications Prior to Admission medications   Medication Sig Start Date End Date Taking? Authorizing Provider  methocarbamol (ROBAXIN) 500 MG tablet Take 1 tablet (500 mg total) by mouth 2 (two) times daily. 02/18/20   Mare Ferrari, PA-C  SUMAtriptan (IMITREX) 50 MG tablet Take 50 mg by mouth every 2 (two) hours as needed for migraine. May repeat in 2 hours if headache persists or recurs.    [provider]    Allergies    Patient has no known allergies.  Review of Systems   Review of Systems  Constitutional: Positive for chills. Negative for fever.  HENT: Negative for ear pain and sore throat.   Eyes: Negative for pain and visual disturbance.  Respiratory: Positive for cough and shortness of breath.   Cardiovascular: Positive for chest pain. Negative for palpitations.  Gastrointestinal: Negative for abdominal pain and vomiting.  Genitourinary: Negative for dysuria and hematuria.  Musculoskeletal: Negative for arthralgias and back pain.  Skin: Negative for color change and rash.  Neurological: Negative for seizures and syncope.  All other systems reviewed and are negative.   Physical Exam Updated Vital Signs BP (!) 161/118   Pulse 76   Temp 99.3 F (37.4 C) (Oral)   Resp 10   Ht 6\' 3"  (1.905 m)   Wt 122.9 kg   SpO2 100%   BMI 33.86 kg/m   Physical Exam Vitals and nursing note reviewed.  Constitutional:      General: He is not in acute distress.    Appearance: He is well-developed. He is obese. He  is not ill-appearing, toxic-appearing or diaphoretic.     Comments: Well-appearing, nondiaphoretic, no respiratory distress  HENT:     Head: Normocephalic and atraumatic.  Eyes:     Conjunctiva/sclera: Conjunctivae normal.  Cardiovascular:     Rate and Rhythm: Normal rate and regular rhythm.     Pulses:          Radial pulses are 2+ on the right side and 2+ on the left side.       Dorsalis pedis pulses are detected w/ Doppler on the right side and 2+ on the left side.     Heart sounds: Normal heart sounds. No murmur heard.   Pulmonary:     Effort: Pulmonary effort is normal. No respiratory distress.     Breath sounds: Normal breath sounds.     Comments: Lungs clear to  auscultation, no rales or wheezes or rhonchi.  Equal chest rise.  No evidence of tachypnea, patient speaking full sentences without increased work of breathing. Chest:     Chest wall: Tenderness present.     Comments: Reproducible chest wall/right trapezius muscle tenderness.  No C, T, L-spine tenderness.  Moving all 4 extremities without difficulty. Abdominal:     Palpations: Abdomen is soft.     Tenderness: There is no abdominal tenderness.  Musculoskeletal:     Cervical back: Neck supple.     Right lower leg: No edema.     Left lower leg: No edema.  Skin:    General: Skin is warm and dry.     Findings: No erythema or rash.  Neurological:     General: No focal deficit present.     Mental Status: He is alert.     Motor: No weakness.     ED Results / Procedures / Treatments   Labs (all labs ordered are listed, but only abnormal results are displayed) Labs Reviewed  CBC WITH DIFFERENTIAL/PLATELET - Abnormal; Notable for the following components:      Result Value   MCH 25.8 (*)    All other components within normal limits  COMPREHENSIVE METABOLIC PANEL - Abnormal; Notable for the following components:   Glucose, Bld 106 (*)    All other components within normal limits  SARS CORONAVIRUS 2 BY RT PCR (HOSPITAL ORDER, PERFORMED IN [facility])  D-DIMER, QUANTITATIVE (NOT AT [facility])  TROPONIN I (HIGH SENSITIVITY)  TROPONIN I (HIGH SENSITIVITY)    EKG EKG Interpretation  Date/Time:  Wednesday February 18 2020 10:13:01 EDT Ventricular Rate:  80 PR Interval:  134 QRS Duration: 90 QT Interval:  382 QTC Calculation: 440 R Axis:   57 Text Interpretation: Normal sinus rhythm Nonspecific T wave abnormality Abnormal ECG When compared to prior, more pronounced S1q3T3. No STEMI Confirmed by Theda Belfast ([id number]) on 02/18/2020 11:17:46 AM   Radiology DG Chest Portable 1 View  Result Date: 02/18/2020 CLINICAL DATA:  Right-sided chest pain for 1 week EXAM: PORTABLE CHEST 1  VIEW COMPARISON:  01/30/2019 FINDINGS: The heart size and mediastinal contours are within normal limits. Both lungs are clear. The visualized skeletal structures are unremarkable. IMPRESSION: No active disease. Electronically Signed   By: Duanne Guess D.O.   On: 02/18/2020 12:30    Procedures Procedures (including critical care time)  Medications Ordered in ED Medications  ketorolac (TORADOL) 30 MG/ML injection 30 mg (30 mg Intravenous Given 02/18/20 1451)    ED Course  I have reviewed the triage vital signs and the nursing notes.  Pertinent labs & imaging results  that were available during my care of the patient were reviewed by me and considered in my medical decision making (see chart for details).    MDM Rules/Calculators/A&P                         34 year old male with pain on inspiration, with pain now radiated on his right arm and up his neck into his right ear x1 week.   On presentation, the patient is alert and oriented, nontoxic-appearing, no acute distress, nondiaphoretic, speaking in full sentences without increased work of breathing.  He has been consistently hypertensive with his blood pressure as high as 161/118, however there is no signs of hypertensive urgency/emergency and the patient states that he has a known history of hypertension.  Per chart review, this seems to be consistent with his previous visits.  He does not take any blood pressure medications, which I encouraged him to follow-up with the PCP in order to start some antihypertensive medications.  Other vitals are reassuring, he is not hypoxic or tachycardic.    Physical exam without any acute abnormalities, some reproducible right-sided trapezius/chest wall tenderness.  Moving all 4 extremities without difficulty, 5/5 strength.  Strong DP and radial pulses.  Soft and nontender abdomen.  Lungs clear.    CBC without leukocytosis, CMP without any significant electrode abnormalities, normal BUN/creatinine/AST/ALT.   Covid negative.  D-dimer negative, and delta troponin negative.  Chest x-ray without acute abnormalities, no evidence of pneumonia, pneumothorax.  EKG with increased S1Q3T3 from previous.  However D-dimer is negative, so concern for PE is low.  Patient was given Toradol here in the ER, notes mild improvement in his pain.    I suspect his pain could be secondary to musculoskeletal strain, or possible upper respiratory infection.  Given his exposure to chemicals with arthralgias, he could potentially benefit from further work-up with a PCP.  I discussed the case with Dr. Rush Landmark who is agreeable to this.  I discussed the findings with the patient he is overall reassured.  We discussed possible CT imaging for PE/dissection, however given negative D-dimer likely going out of this is low.  He agrees that he will return if he has any worsening symptoms, will otherwise follow-up with his primary care doctor.  I will prescribe him some Robaxin, patient educated on the sedating side effects of this medication.  Very strict return precautions given, overall work-up reassuring.  All the patient's questions have been answered to his satisfaction, he voices understanding and is agreeable to this plan.  At this stage in the ED course, the patient is medically screened and stable for discharge.  Final Clinical Impression(s) / ED Diagnoses Final diagnoses:  Chest pain, unspecified type    Rx / DC Orders ED Discharge Orders         Ordered    methocarbamol (ROBAXIN) 500 MG tablet  2 times daily     Discontinue  Reprint     02/18/20 1544           Mare Ferrari, PA-C 02/18/20 1549    Tegeler, Canary Brim, MD 02/18/20 [phone]

## 2020-02-18 NOTE — ED Triage Notes (Signed)
Right low chest pain x1 week.  Worse with deep breath.  Pain radiates up to right shoulder and ear.  Right arm pain since this morning. Slight SOB.

## 2021-03-04 IMAGING — DX PORTABLE CHEST - 1 VIEW
1 series · 1 of 1 positions shown · non-contrast
Comparison: January 23, 2019

CLINICAL DATA: Shortness of breath

EXAM:
PORTABLE CHEST 1 VIEW

[chest ap]
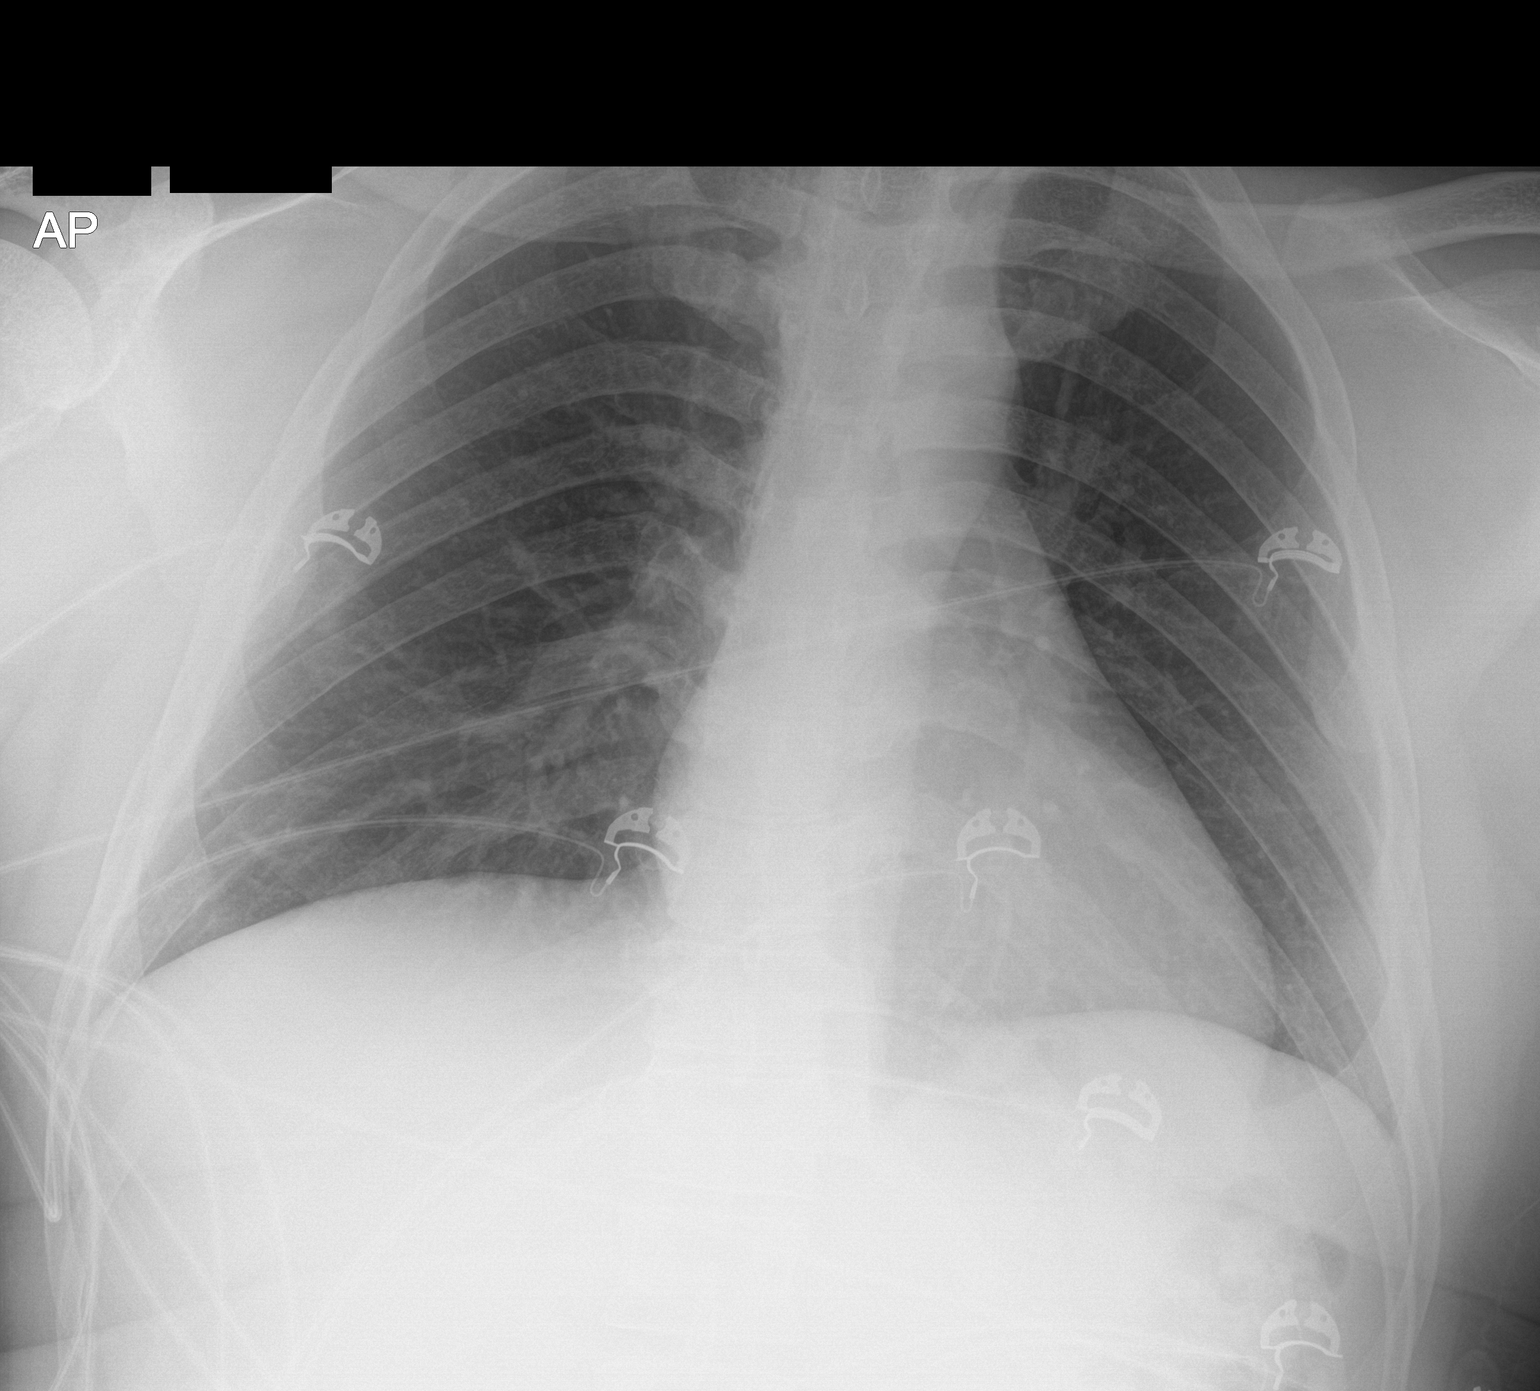

[1 of 1 positions shown; findings below may reference images not displayed]

FINDINGS: There is no edema or consolidation. Heart is upper normal in size
with pulmonary vascularity normal. No adenopathy. No pneumothorax.
No bone lesions.
IMPRESSION: No edema or consolidation.  Heart upper normal in size.

## 2021-04-13 ENCOUNTER — Encounter (HOSPITAL_BASED_OUTPATIENT_CLINIC_OR_DEPARTMENT_OTHER): Payer: Self-pay | Admitting: Emergency Medicine

## 2021-04-13 ENCOUNTER — Other Ambulatory Visit: Payer: Self-pay

## 2021-04-13 ENCOUNTER — Emergency Department (HOSPITAL_BASED_OUTPATIENT_CLINIC_OR_DEPARTMENT_OTHER)
Admission: EM | Admit: 2021-04-13 | Discharge: 2021-04-13 | Disposition: A | Discharge status: Home / Self Care | Payer: BC Managed Care – PPO | Attending: Emergency Medicine | Admitting: Emergency Medicine

## 2021-04-13 DIAGNOSIS — H938X1 Other specified disorders of right ear: Secondary | ICD-10-CM

## 2021-04-13 DIAGNOSIS — R0981 Nasal congestion: Secondary | ICD-10-CM | POA: Insufficient documentation

## 2021-04-13 MED ORDER — FLUTICASONE PROPIONATE 50 MCG/ACT NA SUSP: 2.0000 | Freq: Every day | NASAL | 2 refills | Status: AC

## 2021-04-13 NOTE — Discharge Instructions (Addendum)
Use the Flonase in each nare with 2 puffs twice daily.  Do this after clearing out all of the snot in your nose.    You can take Tylenol or Motrin as needed for pain.  Avoid using any Q-tips.  If things change or worsening return back to the ED for evaluation, I also advise setting up care with a primary care provider to follow-up within the future.

## 2021-04-13 NOTE — ED Provider Notes (Signed)
[facility] EMERGENCY DEPARTMENT Provider Note   CSN: [id number] Arrival date & time: 04/13/21  0786     History Chief Complaint  Patient presents with   Ear Fullness    Matthew Daniels is a 35 y.o. male.   Ear Fullness   Patient presents with nasal congestion and right ear fullness.  Patient states he started having sinus congestion about 2 weeks ago, it is associated with mucopurulent discharge.  The sinus infection has improved with time, Mucinex, Motrin.  However, about 5 days ago he started having fullness in the left ear.  He says his hearing feels muffled and it starting to hurt.  He has not tried any alleviating factors for the ear other than the Motrin.  Is not having any fevers or chills, no recent air travel.  Past Medical History:  Diagnosis Date   Migraine     There are no problems to display for this patient.   History reviewed. No pertinent surgical history.     History reviewed. No pertinent family history.  Social History   Tobacco Use   Smoking status: Never   Smokeless tobacco: Never  Substance Use Topics   Alcohol use: No   Drug use: No    Home Medications Prior to Admission medications   Medication Sig Start Date End Date Taking? Authorizing Provider  methocarbamol (ROBAXIN) 500 MG tablet Take 1 tablet (500 mg total) by mouth 2 (two) times daily. 02/18/20   Mare Ferrari, PA-C  SUMAtriptan (IMITREX) 50 MG tablet Take 50 mg by mouth every 2 (two) hours as needed for migraine. May repeat in 2 hours if headache persists or recurs.    [provider]    Allergies    Patient has no known allergies.  Review of Systems   Review of Systems  Constitutional:  Negative for fever.  HENT:  Positive for congestion, ear discharge and ear pain. Negative for sneezing.   Respiratory:  Negative for cough.    Physical Exam Updated Vital Signs BP (!) 155/105 (BP Location: Right Arm)   Pulse 81   Temp 97.8 F (36.6 C) (Oral)   Resp  (!) 22   Ht 6\' 3"  (1.905 m)   Wt 124.7 kg   SpO2 99%   BMI 34.37 kg/m   Physical Exam Vitals and nursing note reviewed. Exam conducted with a chaperone present.  Constitutional:      General: He is not in acute distress.    Appearance: Normal appearance.     Comments: Patient is well-appearing  HENT:     Head: Normocephalic and atraumatic.     Right Ear: There is no impacted cerumen.     Left Ear: There is no impacted cerumen.     Ears:     Comments: Right ear has a clear canal, no bulging membrane or erythema.  There is some fluid behind the tympanic membrane.  No debris, no discharge, no cerumen impaction.  TM is intact without perforation.  No tragal tenderness or swelling along the mastoid    Nose: Congestion present.     Comments: Patient with bilateral nasal congestion.    Mouth/Throat:     Mouth: Mucous membranes are moist.     Pharynx: No posterior oropharyngeal erythema.  Eyes:     General: No scleral icterus.    Extraocular Movements: Extraocular movements intact.     Pupils: Pupils are equal, round, and reactive to light.  Skin:    Coloration: Skin is not jaundiced.  Neurological:     Mental Status: He is alert. Mental status is at baseline.     Coordination: Coordination normal.    ED Results / Procedures / Treatments   Labs (all labs ordered are listed, but only abnormal results are displayed) Labs Reviewed - No data to display  EKG None  Radiology No results found.  Procedures Procedures   Medications Ordered in ED Medications - No data to display  ED Course  I have reviewed the triage vital signs and the nursing notes.  Pertinent labs & imaging results that were available during my care of the patient were reviewed by me and considered in my medical decision making (see chart for details).    MDM Rules/Calculators/A&P                           Patient vitals are stable, he is not febrile.  No known history of allergies, but given the  presentation of congestion in conjunction with fluid behind the ear I suspect this is likely due to allergies.  TM is intact without perforation, physical exam is not remarkable for signs of AOM or otitis media.  Do not suspect necrotizing otitis externa.  I suspect that his symptoms are likely due to allergies, will do a trial of Flonase and have him follow-up outpatient with primary care doctor if there are no improvements.    Final Clinical Impression(s) / ED Diagnoses Final diagnoses:  None    Rx / DC Orders ED Discharge Orders     None        Theron Arista, PA-C 04/13/21 1037    Gloris Manchester, MD 04/14/21 [phone]

## 2021-04-13 NOTE — ED Triage Notes (Signed)
Patient with ear "blockage" and fullness following sinus congestion x 4 days.

## 2023-10-24 ENCOUNTER — Ambulatory Visit: Payer: Medicaid Other | Admitting: Family Medicine

## 2023-10-24 ENCOUNTER — Encounter: Payer: Self-pay | Admitting: Family Medicine

## 2023-10-24 VITALS — BP 134/88 | HR 74 | Temp 97.9°F | Resp 18 | Ht 75.0 in | Wt 283.0 lb

## 2023-10-24 DIAGNOSIS — F418 Other specified anxiety disorders: Secondary | ICD-10-CM | POA: Diagnosis not present

## 2023-10-24 DIAGNOSIS — M25561 Pain in right knee: Secondary | ICD-10-CM | POA: Diagnosis not present

## 2023-10-24 DIAGNOSIS — G8929 Other chronic pain: Secondary | ICD-10-CM

## 2023-10-24 DIAGNOSIS — Z7689 Persons encountering health services in other specified circumstances: Secondary | ICD-10-CM

## 2023-10-24 DIAGNOSIS — G43709 Chronic migraine without aura, not intractable, without status migrainosus: Secondary | ICD-10-CM | POA: Diagnosis not present

## 2023-10-24 MED ORDER — BUTALBITAL-APAP-CAFFEINE 50-325-40 MG PO TABS: 1.0000 | ORAL_TABLET | Freq: Four times a day (QID) | ORAL | 3 refills | Status: DC | PRN

## 2023-10-24 MED ORDER — SUMATRIPTAN SUCCINATE 50 MG PO TABS: 50.0000 mg | ORAL_TABLET | ORAL | 2 refills | Status: DC | PRN

## 2023-10-24 NOTE — Progress Notes (Signed)
 New Patient Office Visit  Subjective    Patient ID: Matthew Daniels, male    DOB: [birth year]  Age: 38 y.o. MRN: [id number]  CC:  Chief Complaint  Patient presents with   Establish Care    Patient is here today establish care with a new PCP,   Knee Pain    He would like to discuss an ongoing issue with his right knee.He states that his right knee is getting weaker can't put pressure on it, and sometimes knee goes out.     Migraine    Patient states that he has a history of migraines but states that since December its becoming more frequently and hurting more. He  also states that he has some depression that could be what triggers the migraines    HPI Matthew Daniels presents to establish care  Migraines Pt has hx of migraines. He hasn't been on them for the last year. He reports when he was taking them, this helped his migraines. He reports he was taking Imitrex that helped his migraines. He reports having about 4 migraines a month. He reports the Aleve and Excedrin didn't help.   Right Knee He's had issues with the knee for the last year and a half. He woke up one morning, he had swelling and pain in his knee. He says it feels swollen now. He says it gets stiff. He hasn't had this scan or imaged before.  Depression Flowsheet Row Office Visit from 10/24/2023 in [facility]  PHQ-9 Total Score 23          10/24/2023    9:53 AM  GAD 7 : Generalized Anxiety Score  Nervous, Anxious, on Edge 3  Control/stop worrying 3  Worry too much - different things 3  Trouble relaxing 3  Restless 2  Easily annoyed or irritable 3  Afraid - awful might happen 3  Total GAD 7 Score 20  Anxiety Difficulty Somewhat difficult  Pt reports he's had issues with depression. He has hx of traumatic childhood that he reports he's gotten flashbacks. He's felt like this for the last year.      Outpatient Encounter Medications as of 10/24/2023  Medication Sig   Ubrogepant 100  MG TABS Take by mouth. (Patient not taking: Reported on 10/24/2023)   [DISCONTINUED] topiramate (TOPAMAX) 25 MG tablet 1 TABLET AT BEDTIME FOR 1 WEEK, THEN 2 FOR 1 WEEK, INCREASE TO 3 AT BEDTIME UNTIL SEEN.   fluticasone (FLONASE) 50 MCG/ACT nasal spray Place 2 sprays into both nostrils daily.   methocarbamol (ROBAXIN) 500 MG tablet Take 1 tablet (500 mg total) by mouth 2 (two) times daily. (Patient not taking: Reported on 10/24/2023)   SUMAtriptan (IMITREX) 50 MG tablet Take 50 mg by mouth every 2 (two) hours as needed for migraine. May repeat in 2 hours if headache persists or recurs. (Patient not taking: Reported on 10/24/2023)   No facility-administered encounter medications on file as of 10/24/2023.    Past Medical History:  Diagnosis Date   Migraine     History reviewed. No pertinent surgical history.  Family History  Problem Relation Age of Onset   Depression Mother    Hypertension Mother    Diabetes Mother    Hypertension Father     Social History   Socioeconomic History   Marital status: Single    Spouse name: Not on file   Number of children: 4   Years of education: Not on file   Highest  education level: Not on file  Occupational History   Not on file  Tobacco Use   Smoking status: Never    Passive exposure: Never   Smokeless tobacco: Never  Vaping Use   Vaping status: Never Used  Substance and Sexual Activity   Alcohol use: Never   Drug use: Never   Sexual activity: Yes  Other Topics Concern   Not on file  Social History Narrative   Not on file   Social Drivers of Health   Financial Resource Strain: Not on file  Food Insecurity: Not on file  Transportation Needs: Not on file  Physical Activity: Not on file  Stress: Not on file  Social Connections: Unknown (12/15/2021)   [facility]   Social Network    Social Network: Not on file  Intimate Partner Violence: Not At Risk (05/22/2023)   Received from [facility]   HITS    Over the  last 12 months how often did your partner physically hurt you?: Never    Over the last 12 months how often did your partner insult you or talk down to you?: Never    Over the last 12 months how often did your partner threaten you with physical harm?: Never    Over the last 12 months how often did your partner scream or curse at you?: Never    Review of Systems  Musculoskeletal:  Positive for joint pain.  Neurological:  Positive for headaches.  Psychiatric/Behavioral:  Positive for depression. The patient is nervous/anxious.   All other systems reviewed and are negative.      Objective    BP 134/88   Pulse 74   Temp 97.9 F (36.6 C) (Oral)   Resp 18   Ht 6\' 3"  (1.905 m)   Wt 283 lb (128.4 kg)   SpO2 99%   BMI 35.37 kg/m   Physical Exam Vitals and nursing note reviewed.  Constitutional:      Appearance: Normal appearance. He is obese.  HENT:     Head: Normocephalic and atraumatic.     Right Ear: External ear normal.     Left Ear: External ear normal.     Nose: Nose normal.     Mouth/Throat:     Mouth: Mucous membranes are moist.     Pharynx: Oropharynx is clear.  Eyes:     Conjunctiva/sclera: Conjunctivae normal.     Pupils: Pupils are equal, round, and reactive to light.  Cardiovascular:     Rate and Rhythm: Normal rate and regular rhythm.     Pulses: Normal pulses.     Heart sounds: Normal heart sounds.  Pulmonary:     Effort: Pulmonary effort is normal.     Breath sounds: Normal breath sounds.  Abdominal:     General: Abdomen is flat. Bowel sounds are normal.  Musculoskeletal:        General: Tenderness present. Normal range of motion.     Comments: Crepitus with extension and flexion of right knee  Skin:    General: Skin is warm.     Capillary Refill: Capillary refill takes less than 2 seconds.  Neurological:     General: No focal deficit present.     Mental Status: He is alert and oriented to person, place, and time. Mental status is at baseline.   Psychiatric:        Mood and Affect: Mood normal.        Behavior: Behavior normal.  Thought Content: Thought content normal.        Judgment: Judgment normal.      Assessment & Plan:   Problem List Items Addressed This Visit   None  Encounter to establish care with new doctor  Chronic pain of right knee -     Ambulatory referral to Sports Medicine  Chronic migraine w/o aura w/o status migrainosus, not intractable -     SUMAtriptan Succinate; Take 1 tablet (50 mg total) by mouth every 2 (two) hours as needed for migraine. May repeat in 2 hours if headache persists or recurs.  Dispense: 10 tablet; Refill: 2 -     Butalbital-APAP-Caffeine; Take 1 tablet by mouth every 6 (six) hours as needed for headache.  Dispense: 14 tablet; Refill: 3  Depression with anxiety -     Ambulatory referral to Behavioral Health  Discussed home care instructions for the knee and exercises. Will refer to Dr T Sports Medicine. For chronic migraines, refilled Imitrex 50mg  daily prn and add Fiorcet to use for abortive treatment. Refer to [facility] for counseling for depression/anxiety. No follow-ups on file.   Suzan Slick, MD

## 2023-12-05 ENCOUNTER — Ambulatory Visit (INDEPENDENT_AMBULATORY_CARE_PROVIDER_SITE_OTHER): Admitting: Family Medicine

## 2023-12-05 ENCOUNTER — Encounter: Payer: Self-pay | Admitting: Family Medicine

## 2023-12-05 VITALS — BP 138/86 | HR 90 | Temp 98.3°F | Resp 18 | Ht 75.0 in | Wt 290.3 lb

## 2023-12-05 DIAGNOSIS — Z0000 Encounter for general adult medical examination without abnormal findings: Secondary | ICD-10-CM

## 2023-12-05 DIAGNOSIS — R7302 Impaired glucose tolerance (oral): Secondary | ICD-10-CM | POA: Diagnosis not present

## 2023-12-05 DIAGNOSIS — Z13220 Encounter for screening for lipoid disorders: Secondary | ICD-10-CM

## 2023-12-05 DIAGNOSIS — Z136 Encounter for screening for cardiovascular disorders: Secondary | ICD-10-CM

## 2023-12-05 NOTE — Progress Notes (Signed)
 Complete physical exam  Patient: Matthew Daniels   DOB: Jun 17, 1986   38 y.o. Male  MRN: [id number]  Subjective:    Chief Complaint  Patient presents with   Annual Exam    Patient is here for his annual physical, patient is fasting this morning    Matthew Daniels is a 38 y.o. male who presents today for a complete physical exam. He reports consuming a general diet. The patient does not participate in regular exercise at present. He generally feels well. He reports sleeping poorly. He does not have additional problems to discuss today.    Most recent fall risk assessment:    10/24/2023    9:52 AM  Fall Risk   Falls in the past year? 0  Number falls in past yr: 0  Injury with Fall? 0  Risk for fall due to : No Fall Risks  Follow up Falls evaluation completed     Most recent depression screenings:    10/24/2023    9:52 AM  PHQ 2/9 Scores  PHQ - 2 Score 6  PHQ- 9 Score 23    Vision:Within last year  Patient Active Problem List   Diagnosis Date Noted   Chronic migraine w/o aura w/o status migrainosus, not intractable 10/31/2017   Major depressive disorder, single episode, moderate (HCC) 10/31/2017   Dissecting cellulitis of scalp 08/15/2012   Past Medical History:  Diagnosis Date   Migraine    History reviewed. No pertinent surgical history. Social History   Tobacco Use   Smoking status: Never    Passive exposure: Never   Smokeless tobacco: Never  Vaping Use   Vaping status: Never Used  Substance Use Topics   Alcohol use: Never   Drug use: Never   Family Status  Relation Name Status   Mother  Alive   Father  Alive  No partnership data on file   No Known Allergies    Patient Care Team: Manette Section, MD as PCP - General (Family Medicine)   Outpatient Medications Prior to Visit  Medication Sig   butalbital -acetaminophen -caffeine  (FIORICET) 50-325-40 MG tablet Take 1 tablet by mouth every 6 (six) hours as needed for headache.   fluticasone  (FLONASE )  50 MCG/ACT nasal spray Place 2 sprays into both nostrils daily.   methocarbamol  (ROBAXIN ) 500 MG tablet Take 1 tablet (500 mg total) by mouth 2 (two) times daily.   SUMAtriptan  (IMITREX ) 50 MG tablet Take 1 tablet (50 mg total) by mouth every 2 (two) hours as needed for migraine. May repeat in 2 hours if headache persists or recurs.   No facility-administered medications prior to visit.    Review of Systems  All other systems reviewed and are negative.        Objective:     BP (!) 152/87   Pulse 90   Temp 98.3 F (36.8 C) (Oral)   Resp 18   Ht 6\' 3"  (1.905 m)   Wt 290 lb 4.8 oz (131.7 kg)   SpO2 98%   BMI 36.29 kg/m  BP Readings from Last 3 Encounters:  12/05/23 (!) 152/87  10/24/23 134/88  04/13/21 (!) 155/105      Physical Exam Vitals and nursing note reviewed.  Constitutional:      Appearance: Normal appearance. He is obese.  HENT:     Head: Normocephalic and atraumatic.     Right Ear: Tympanic membrane, ear canal and external ear normal.     Left Ear: Tympanic membrane, ear canal and external ear  normal.     Nose: Nose normal.     Mouth/Throat:     Mouth: Mucous membranes are moist.     Pharynx: Oropharynx is clear.  Eyes:     Conjunctiva/sclera: Conjunctivae normal.     Pupils: Pupils are equal, round, and reactive to light.  Cardiovascular:     Rate and Rhythm: Normal rate and regular rhythm.     Pulses: Normal pulses.     Heart sounds: Normal heart sounds.  Pulmonary:     Effort: Pulmonary effort is normal.     Breath sounds: Normal breath sounds.  Abdominal:     General: Abdomen is flat. Bowel sounds are normal.  Skin:    General: Skin is warm.     Capillary Refill: Capillary refill takes less than 2 seconds.  Neurological:     General: No focal deficit present.     Mental Status: He is alert and oriented to person, place, and time. Mental status is at baseline.  Psychiatric:        Mood and Affect: Mood normal.        Behavior: Behavior  normal.        Thought Content: Thought content normal.        Judgment: Judgment normal.     No results found for any visits on 12/05/23. Last CBC Lab Results  Component Value Date   WBC 5.3 02/18/2020   HGB 14.3 02/18/2020   HCT 44.5 02/18/2020   MCV 80.2 02/18/2020   MCH 25.8 (L) 02/18/2020   RDW 14.6 02/18/2020   PLT 237 02/18/2020   Last metabolic panel Lab Results  Component Value Date   GLUCOSE 106 (H) 02/18/2020   NA 138 02/18/2020   K 4.0 02/18/2020   CL 103 02/18/2020   CO2 27 02/18/2020   BUN 10 02/18/2020   CREATININE 1.03 02/18/2020   GFRNONAA >60 02/18/2020   CALCIUM 9.0 02/18/2020   PROT 7.7 02/18/2020   ALBUMIN 4.3 02/18/2020   BILITOT 0.4 02/18/2020   ALKPHOS 49 02/18/2020   AST 19 02/18/2020   ALT 26 02/18/2020   ANIONGAP 8 02/18/2020   Last lipids No results found for: "CHOL", "HDL", "LDLCALC", "LDLDIRECT", "TRIG", "CHOLHDL" Last hemoglobin A1c No results found for: "HGBA1C"      Assessment & Plan:    Routine Health Maintenance and Physical Exam  Immunization History  Administered Date(s) Administered   PPD Test 05/17/2022    Health Maintenance  Topic Date Due   HIV Screening  Never done   Hepatitis C Screening  Never done   DTaP/Tdap/Td (1 - Tdap) Never done   COVID-19 Vaccine (1 - 2024-25 season) Never done   INFLUENZA VACCINE  03/07/2024   HPV VACCINES  Aged Out   Meningococcal B Vaccine  Aged Out    Discussed health benefits of physical activity, and encouraged him to engage in regular exercise appropriate for his age and condition.  Problem List Items Addressed This Visit   None  No follow-ups on file. Annual physical exam  Encounter for lipid screening for cardiovascular disease -     Lipid panel  Impaired glucose tolerance -     CBC with Differential/Platelet -     Comprehensive metabolic panel with GFR -     Hemoglobin A1c   Screening labs See in 3 months sooner pending lab results    Manette Section,  MD

## 2023-12-06 ENCOUNTER — Encounter: Payer: Self-pay | Admitting: Family Medicine

## 2023-12-06 LAB — CBC WITH DIFFERENTIAL/PLATELET
Basophils Absolute: 0 10*3/uL (ref 0.0–0.2)
Basos: 0 %
EOS (ABSOLUTE): 0.1 10*3/uL (ref 0.0–0.4)
Eos: 2 %
Hematocrit: 42.2 % (ref 37.5–51.0)
Hemoglobin: 13.9 g/dL (ref 13.0–17.7)
Immature Grans (Abs): 0 10*3/uL (ref 0.0–0.1)
Immature Granulocytes: 0 %
Lymphocytes Absolute: 1.9 10*3/uL (ref 0.7–3.1)
Lymphs: 40 %
MCH: 25.4 pg — ABNORMAL LOW (ref 26.6–33.0)
MCHC: 32.9 g/dL (ref 31.5–35.7)
MCV: 77 fL — ABNORMAL LOW (ref 79–97)
Monocytes Absolute: 0.4 10*3/uL (ref 0.1–0.9)
Monocytes: 8 %
Neutrophils Absolute: 2.3 10*3/uL (ref 1.4–7.0)
Neutrophils: 50 %
Platelets: 227 10*3/uL (ref 150–450)
RBC: 5.48 x10E6/uL (ref 4.14–5.80)
RDW: 14.3 % (ref 11.6–15.4)
WBC: 4.6 10*3/uL (ref 3.4–10.8)

## 2023-12-06 LAB — COMPREHENSIVE METABOLIC PANEL WITH GFR
ALT: 33 IU/L (ref 0–44)
AST: 29 IU/L (ref 0–40)
Albumin: 4.5 g/dL (ref 4.1–5.1)
Alkaline Phosphatase: 70 IU/L (ref 44–121)
BUN/Creatinine Ratio: 10 (ref 9–20)
BUN: 12 mg/dL (ref 6–20)
Bilirubin Total: 0.2 mg/dL (ref 0.0–1.2)
CO2: 25 mmol/L (ref 20–29)
Calcium: 9.7 mg/dL (ref 8.7–10.2)
Chloride: 104 mmol/L (ref 96–106)
Creatinine, Ser: 1.22 mg/dL (ref 0.76–1.27)
Globulin, Total: 2.9 g/dL (ref 1.5–4.5)
Glucose: 89 mg/dL (ref 70–99)
Potassium: 4.3 mmol/L (ref 3.5–5.2)
Sodium: 142 mmol/L (ref 134–144)
Total Protein: 7.4 g/dL (ref 6.0–8.5)
eGFR: 77 mL/min/{1.73_m2} (ref >59)

## 2023-12-06 LAB — LIPID PANEL
Chol/HDL Ratio: 3.3 ratio (ref 0.0–5.0)
Cholesterol, Total: 185 mg/dL (ref 100–199)
HDL: 56 mg/dL (ref >39)
LDL Chol Calc (NIH): 105 mg/dL — ABNORMAL HIGH (ref 0–99)
Triglycerides: 135 mg/dL (ref 0–149)
VLDL Cholesterol Cal: 24 mg/dL (ref 5–40)

## 2023-12-06 LAB — HEMOGLOBIN A1C
Est. average glucose Bld gHb Est-mCnc: 126 mg/dL
Hgb A1c MFr Bld: 6 % — ABNORMAL HIGH (ref 4.8–5.6)

## 2023-12-19 ENCOUNTER — Encounter: Payer: Self-pay | Admitting: Sports Medicine

## 2023-12-19 ENCOUNTER — Ambulatory Visit

## 2023-12-19 ENCOUNTER — Ambulatory Visit: Admitting: Sports Medicine

## 2023-12-19 DIAGNOSIS — M25561 Pain in right knee: Secondary | ICD-10-CM | POA: Diagnosis not present

## 2023-12-19 DIAGNOSIS — G8929 Other chronic pain: Secondary | ICD-10-CM | POA: Diagnosis not present

## 2023-12-19 DIAGNOSIS — M25562 Pain in left knee: Secondary | ICD-10-CM

## 2023-12-19 NOTE — Assessment & Plan Note (Signed)
 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 He reports intermittent swelling, pain. He had a few x-rays over the past several years that have all been negative. Today he has some tenderness with patellar compression. He does have a positive Thessaly sign concerning for a meniscal injury. Due to the failure of conservative treatment for many years we will proceed with updated x-rays, MRI. He is not hurting as much currently so we will hold off on NSAID treatment for now, I did advise him he would likely need some physical therapy. If we do see significant meniscal tearing on the MRI we will consider adding a steroid injection to the physical therapy. He did ask about viscosupplementation, this is an option should he fail the above.

## 2023-12-19 NOTE — Progress Notes (Signed)
    Procedures performed today:    None.  Independent interpretation of notes and tests performed by another provider:   None.  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 He reports intermittent swelling, pain. He had a few x-rays over the past several years that have all been negative. Today he has some tenderness with patellar compression. He does have a positive Thessaly sign concerning for a meniscal injury. Due to the failure of conservative treatment for many years we will proceed with updated x-rays, MRI. He is not hurting as much currently so we will hold off on NSAID treatment for now, I did advise him he would likely need some physical therapy. If we do see significant meniscal tearing on the MRI we will consider adding a steroid injection to the physical therapy. He did ask about viscosupplementation, this is an option should he fail the above.    ____________________________________________ Joselyn Nicely. Sandy Crumb, M.D., ABFM., CAQSM., AME. Primary Care and Sports Medicine [facility]  Adjunct Professor of [facility] Medicine  [employer]  Restaurant manager, fast food

## 2023-12-20 ENCOUNTER — Ambulatory Visit: Payer: Self-pay | Admitting: Sports Medicine

## 2023-12-29 ENCOUNTER — Ambulatory Visit

## 2023-12-29 DIAGNOSIS — G8929 Other chronic pain: Secondary | ICD-10-CM

## 2023-12-29 DIAGNOSIS — M25561 Pain in right knee: Secondary | ICD-10-CM

## 2024-01-04 NOTE — Therapy (Signed)
 OUTPATIENT PHYSICAL THERAPY LOWER EXTREMITY EVALUATION   Patient Name: Matthew Daniels MRN: [id number] DOB:Dec 09, 1985, 38 y.o., male Today's Date: 01/07/2024  END OF SESSION:  PT End of Session - 01/07/24 1030     Visit Number 1    Number of Visits 13    Date for PT Re-Evaluation 02/23/24    Authorization Type [facility]- [employer]    Authorization - Visit Number 1    Authorization - Number of Visits 27    PT Start Time 1030   patient late   PT Stop Time 1059    PT Time Calculation (min) 29 min    Activity Tolerance Patient tolerated treatment well    Behavior During Therapy WFL for tasks assessed/performed             Past Medical History:  Diagnosis Date   Migraine    History reviewed. No pertinent surgical history. Patient Active Problem List   Diagnosis Date Noted   Chronic pain of right knee 12/19/2023   Chronic migraine w/o aura w/o status migrainosus, not intractable 10/31/2017   Major depressive disorder, single episode, moderate (HCC) 10/31/2017   Dissecting cellulitis of scalp 08/15/2012    PCP: Manette Section, MD  REFERRING PROVIDER: Gean Keels, MD   REFERRING DIAG: [phone] (ICD-10-CM) - Chronic pain of right knee   THERAPY DIAG:  Chronic pain of right knee  Muscle weakness (generalized)  Localized edema  Rationale for Evaluation and Treatment: Rehabilitation  ONSET DATE: December 2023   SUBJECTIVE:   SUBJECTIVE STATEMENT: Patient reports the Rt knee started bothering him about 1.5 years ago. He woke up and the knee was swollen and stiff, but does not recall a MOI. It will pop and give out on him at times. He tried to strength train on his own, but then tried to run again and it was swollen and painful when he tried that. He denies any previous injury to the knee.   PERTINENT HISTORY: None  PAIN:  Are you having pain? Yes: NPRS scale: none currently; 10 at worst Pain location: Rt anterior,lateral and posterior knee Pain  description: dull, ache Aggravating factors: running, prolonged walking/standing/driving Relieving factors: rest  PRECAUTIONS: None  RED FLAGS: None   WEIGHT BEARING RESTRICTIONS: No  FALLS:  Has patient fallen in last 6 months? No  LIVING ENVIRONMENT: Lives with: lives with their family Lives in: House/apartment Stairs: Yes: Internal: flight steps; on left going up and External: 8 steps; on right going up Has following equipment at home: None  OCCUPATION: Nature conservation officer at [employer]   PLOF: Independent  PATIENT GOALS: "some relief"   NEXT MD VISIT: nothing scheduled   OBJECTIVE:  Note: Objective measures were completed at Evaluation unless otherwise noted.  DIAGNOSTIC FINDINGS:  MRI pending   PATIENT SURVEYS:   LEFS: 28/80  COGNITION: Overall cognitive status: Within functional limits for tasks assessed     SENSATION: Not tested  EDEMA:  Mild swelling Rt knee   MUSCLE LENGTH: Hamstrings: mild tightness bilaterally    POSTURE: pes planus, mild genu valgum  PALPATION: TTP joint line lateral > medial   LOWER EXTREMITY ROM:  Active ROM Right eval Left eval  Hip flexion    Hip extension    Hip abduction    Hip adduction    Hip internal rotation    Hip external rotation    Knee flexion 115 tight  125  Knee extension WNL WNL  Ankle dorsiflexion    Ankle plantarflexion  Ankle inversion    Ankle eversion     (Blank rows = not tested)  LOWER EXTREMITY MMT:  MMT Right eval Left eval  Hip flexion 4- 4  Hip extension 4- 4-  Hip abduction 4- 4+  Hip adduction    Hip internal rotation    Hip external rotation    Knee flexion 4+ 5  Knee extension 4 5  Ankle dorsiflexion    Ankle plantarflexion    Ankle inversion    Ankle eversion     (Blank rows = not tested)  LOWER EXTREMITY SPECIAL TESTS:  McMurray's inconclusive- patient unable to relax Thessaly (+) for pain  Amalia Badder (+)  Thomas (+) bilateral   FUNCTIONAL TESTS:  Squat: Lt  weight shift  SLS- significant arch collapse 15 seconds each   GAIT: Distance walked: 20 ft  Assistive device utilized: None Level of assistance: Complete Independence Comments: excessive pronation during stance    [facility] Adult PT Treatment:                                                DATE: 01/07/24 Therapeutic Exercise: Demonstrated,performed, and issued initial HEP.   Self Care: Ice for pain, swelling Activities to avoid currently including deep squatting, running, cutting     PATIENT EDUCATION:  Education details: see treatment Person educated: Patient Education method: Explanation, Demonstration, Tactile cues, Verbal cues, and Handouts Education comprehension: verbalized understanding, returned demonstration, verbal cues required, tactile cues required, and needs further education  HOME EXERCISE PROGRAM: Access Code: [email or web address] URL: [email or web address] Date: 01/07/2024 Prepared by: Forrestine Ike  Exercises - Sidelying Hip Abduction  - 1 x daily - 7 x weekly - 3 sets - 10 reps - Supine Active Straight Leg Raise  - 1 x daily - 7 x weekly - 3 sets - 10 reps - Prone Hip Extension  - 1 x daily - 7 x weekly - 3 sets - 10 reps - Modified Thomas Stretch  - 1 x daily - 7 x weekly - 3 sets - 30 sec  hold  ASSESSMENT:  CLINICAL IMPRESSION: Patient is a 38 y.o. male who was seen today for physical therapy evaluation and treatment for chronic Rt knee pain. Upon assessment he is noted to have joint line tenderness, positive Thessaly special test, mild limitation into knee flexion AROM, Rt knee weakness, significant hip weakness, and pes planus. He will benefit from skilled PT to address the above stated deficits in order to optimize his function and assist in overall pain reduction.   OBJECTIVE IMPAIRMENTS: Abnormal gait, decreased activity tolerance, decreased balance, decreased knowledge of condition, decreased mobility, difficulty walking, decreased ROM, decreased  strength, increased edema, impaired flexibility, postural dysfunction, and pain.   ACTIVITY LIMITATIONS: carrying, lifting, bending, sitting, standing, squatting, stairs, transfers, and locomotion level  PARTICIPATION LIMITATIONS: cleaning, driving, shopping, community activity, and occupation  PERSONAL FACTORS: Age, Fitness, Profession, Time since onset of injury/illness/exacerbation, and 1 comorbidity: see PMH above  are also affecting patient's functional outcome.   REHAB POTENTIAL: Good  CLINICAL DECISION MAKING: Stable/uncomplicated  EVALUATION COMPLEXITY: Low   GOALS: Goals reviewed with patient? Yes  SHORT TERM GOALS: Target date: 01/28/2024   Patient will be independent and compliant with initial HEP.   Baseline: issued at eval Goal status: INITIAL  2.  Patient will demonstrate at least 120 degrees of Rt knee flexion AROM to  improve ability to complete squatting activity.  Baseline: see above  Goal status: INITIAL  3.  Patient will demonstrate proper squat mechanics.  Baseline: Lt weight shift  Goal status: INITIAL    LONG TERM GOALS: Target date: 02/23/24  Patient will score >/= 40/80 on the LEFS (MCID is 9) to signify clinically meaningful improvement in functional abilities.   Baseline: see above Goal status: INITIAL  2.  Patient will demonstrate 5/5 Rt knee strength to improve stability with stair negotiation  Baseline: see above Goal status: INITIAL  3.  Patient will demonstrate at least 4+/5 hip strength to improve stability with prolonged walking activity.  Baseline: see above Goal status: INITIAL  4.  Patient will report pain at worst rated as </= 5/10 to reduce current functional limitations.  Baseline: 10 Goal status: INITIAL    PLAN:  PT FREQUENCY: 2x/week  PT DURATION: 6 weeks  PLANNED INTERVENTIONS: 97164- PT Re-evaluation, 97110-Therapeutic exercises, 97530- Therapeutic activity, [id number]- Neuromuscular re-education, 97535- Self Care,  97140- Manual therapy, [id number]- Gait training, 16109- Aquatic Therapy, [phone]- Electrical stimulation (unattended), [phone]- Electrical stimulation (manual), 97016- Vasopneumatic device, Taping, Dry Needling, Cryotherapy, and Moist heat  PLAN FOR NEXT SESSION: review and progress HEP prn; knee and hip strengthening; knee flexion ROM, intrinsic foot strengthening, consider arch taping for pes planus    Forrestine Ike, PT, DPT, ATC 01/07/24 12:46 PM

## 2024-01-07 ENCOUNTER — Ambulatory Visit: Attending: Sports Medicine

## 2024-01-07 ENCOUNTER — Other Ambulatory Visit: Payer: Self-pay

## 2024-01-07 DIAGNOSIS — R600 Localized edema: Secondary | ICD-10-CM | POA: Diagnosis not present

## 2024-01-07 DIAGNOSIS — G8929 Other chronic pain: Secondary | ICD-10-CM | POA: Insufficient documentation

## 2024-01-07 DIAGNOSIS — M25561 Pain in right knee: Secondary | ICD-10-CM | POA: Diagnosis present

## 2024-01-07 DIAGNOSIS — M6281 Muscle weakness (generalized): Secondary | ICD-10-CM | POA: Insufficient documentation

## 2024-01-11 ENCOUNTER — Ambulatory Visit

## 2024-01-11 DIAGNOSIS — M6281 Muscle weakness (generalized): Secondary | ICD-10-CM

## 2024-01-11 DIAGNOSIS — G8929 Other chronic pain: Secondary | ICD-10-CM

## 2024-01-11 DIAGNOSIS — R600 Localized edema: Secondary | ICD-10-CM

## 2024-01-11 NOTE — Therapy (Signed)
 OUTPATIENT PHYSICAL THERAPY LOWER EXTREMITY TREATMENT   Patient Name: Matthew Daniels MRN: [id number] DOB:1986-05-24, 38 y.o., male Today's Date: 01/11/2024  END OF SESSION:  PT End of Session - 01/11/24 0758     Visit Number 2    Number of Visits 13    Date for PT Re-Evaluation 02/23/24    Authorization Type [facility]- [employer]    Authorization - Visit Number 2    Authorization - Number of Visits 27    PT Start Time 0800    PT Stop Time 0848    PT Time Calculation (min) 48 min    Activity Tolerance Patient tolerated treatment well    Behavior During Therapy WFL for tasks assessed/performed            Past Medical History:  Diagnosis Date   Migraine    History reviewed. No pertinent surgical history. Patient Active Problem List   Diagnosis Date Noted   Chronic pain of right knee 12/19/2023   Chronic migraine w/o aura w/o status migrainosus, not intractable 10/31/2017   Major depressive disorder, single episode, moderate (HCC) 10/31/2017   Dissecting cellulitis of scalp 08/15/2012    PCP: Manette Section, MD  REFERRING PROVIDER: Gean Keels, MD   REFERRING DIAG: [phone] (ICD-10-CM) - Chronic pain of right knee   THERAPY DIAG:  Chronic pain of right knee  Muscle weakness (generalized)  Localized edema  Rationale for Evaluation and Treatment: Rehabilitation  ONSET DATE: December 2023   SUBJECTIVE:   SUBJECTIVE STATEMENT: Patient reports no changes in symptoms in Rt knee; states he is icing at home.   EVAL: Patient reports the Rt knee started bothering him about 1.5 years ago. He woke up and the knee was swollen and stiff, but does not recall a MOI. It will pop and give out on him at times. He tried to strength train on his own, but then tried to run again and it was swollen and painful when he tried that. He denies any previous injury to the knee.    PERTINENT HISTORY: None   PAIN:  Are you having pain? Yes: NPRS scale: none currently;  10 at worst Pain location: Rt anterior,lateral and posterior knee Pain description: dull, ache Aggravating factors: running, prolonged walking/standing/driving Relieving factors: rest  PRECAUTIONS: None  RED FLAGS: None   WEIGHT BEARING RESTRICTIONS: No  FALLS:  Has patient fallen in last 6 months? No  LIVING ENVIRONMENT: Lives with: lives with their family Lives in: House/apartment Stairs: Yes: Internal: flight steps; on left going up and External: 8 steps; on right going up Has following equipment at home: None  OCCUPATION: Nature conservation officer at [employer]   PLOF: Independent  PATIENT GOALS: "some relief"   NEXT MD VISIT: nothing scheduled   OBJECTIVE:  Note: Objective measures were completed at Evaluation unless otherwise noted.  DIAGNOSTIC FINDINGS:  MRI pending   PATIENT SURVEYS:   LEFS: 28/80  COGNITION: Overall cognitive status: Within functional limits for tasks assessed     SENSATION: Not tested  EDEMA:  Mild swelling Rt knee   MUSCLE LENGTH: Hamstrings: mild tightness bilaterally    POSTURE: pes planus, mild genu valgum  PALPATION: TTP joint line lateral > medial   LOWER EXTREMITY ROM:  Active ROM Right eval Left eval  Hip flexion    Hip extension    Hip abduction    Hip adduction    Hip internal rotation    Hip external rotation    Knee flexion 115 tight  125  Knee extension WNL WNL  Ankle dorsiflexion    Ankle plantarflexion    Ankle inversion    Ankle eversion     (Blank rows = not tested)  LOWER EXTREMITY MMT:  MMT Right eval Left eval  Hip flexion 4- 4  Hip extension 4- 4-  Hip abduction 4- 4+  Hip adduction    Hip internal rotation    Hip external rotation    Knee flexion 4+ 5  Knee extension 4 5  Ankle dorsiflexion    Ankle plantarflexion    Ankle inversion    Ankle eversion     (Blank rows = not tested)  LOWER EXTREMITY SPECIAL TESTS:  McMurray's inconclusive- patient unable to relax Thessaly (+) for  pain  Amalia Badder (+)  Thomas (+) bilateral   FUNCTIONAL TESTS:  Squat: Lt weight shift  SLS- significant arch collapse 15 seconds each   GAIT: Distance walked: 20 ft  Assistive device utilized: None Level of assistance: Complete Independence Comments: excessive pronation during stance    [facility] Adult PT Treatment:                                                DATE: 01/11/2024 Therapeutic Exercise: Side Lying: Bent knee hip abd Straight leg hip abd Leg lifts over and back Prone HS curls RTB --> GTB Manual Therapy: Arch taping  Neuromuscular re-ed: Seated hip add/abd isometrics 5x5" each Hooklying hip add/abd isometrics 5x5" each  Small range SLR (Rt) 10x5" Rt hamstring isometric 10x5" Prone hip extension straight leg x10 -> bent knee x10 Seated knee extension + isometric hold with black TB Towel scrunch   [facility] Adult PT Treatment:                                                DATE: 01/07/24 Therapeutic Exercise: Demonstrated,performed, and issued initial HEP.  Self Care: Ice for pain, swelling Activities to avoid currently including deep squatting, running, cutting     PATIENT EDUCATION:  Education details: Updated HEP Person educated: Patient Education method: Explanation, Demonstration, Tactile cues, Verbal cues, and Handouts Education comprehension: verbalized understanding, returned demonstration, verbal cues required, tactile cues required, and needs further education  HOME EXERCISE PROGRAM: Access Code: [email or web address] URL: [email or web address] Date: 01/11/2024 Prepared by: Sims Duck  Exercises - Sidelying Hip Abduction  - 1 x daily - 7 x weekly - 3 sets - 10 reps - Supine Active Straight Leg Raise  - 1 x daily - 7 x weekly - 3 sets - 10 reps - Prone Hip Extension  - 1 x daily - 7 x weekly - 3 sets - 10 reps - Modified Thomas Stretch  - 1 x daily - 7 x weekly - 3 sets - 30 sec  hold - Prone Hip Extension with Bent Knee  - 1 x daily - 7 x weekly - 3  sets - 10 reps - Towel Scrunches  - 1 x daily - 7 x weekly - 3 sets - 10 reps  ASSESSMENT:  CLINICAL IMPRESSION: Exercises progressed with focus on glute and quad strengthening. Intrinsic foot strengthening incorporated and added to HEP. Noted weakness in glutes with fatigue during side lying hip abd variations. Arch taping applied to Rt foot to support  for pes planus; will follow-up at next visit.   EVAL: Patient is a 38 y.o. male who was seen today for physical therapy evaluation and treatment for chronic Rt knee pain. Upon assessment he is noted to have joint line tenderness, positive Thessaly special test, mild limitation into knee flexion AROM, Rt knee weakness, significant hip weakness, and pes planus. He will benefit from skilled PT to address the above stated deficits in order to optimize his function and assist in overall pain reduction.   OBJECTIVE IMPAIRMENTS: Abnormal gait, decreased activity tolerance, decreased balance, decreased knowledge of condition, decreased mobility, difficulty walking, decreased ROM, decreased strength, increased edema, impaired flexibility, postural dysfunction, and pain.   ACTIVITY LIMITATIONS: carrying, lifting, bending, sitting, standing, squatting, stairs, transfers, and locomotion level  PARTICIPATION LIMITATIONS: cleaning, driving, shopping, community activity, and occupation  PERSONAL FACTORS: Age, Fitness, Profession, Time since onset of injury/illness/exacerbation, and 1 comorbidity: see PMH above  are also affecting patient's functional outcome.   REHAB POTENTIAL: Good  CLINICAL DECISION MAKING: Stable/uncomplicated  EVALUATION COMPLEXITY: Low   GOALS: Goals reviewed with patient? Yes  SHORT TERM GOALS: Target date: 01/28/2024  Patient will be independent and compliant with initial HEP.  Baseline: issued at eval Goal status: INITIAL  2.  Patient will demonstrate at least 120 degrees of Rt knee flexion AROM to improve ability to  complete squatting activity.  Baseline: see above  Goal status: INITIAL  3.  Patient will demonstrate proper squat mechanics.  Baseline: Lt weight shift  Goal status: INITIAL    LONG TERM GOALS: Target date: 02/23/24  Patient will score >/= 40/80 on the LEFS (MCID is 9) to signify clinically meaningful improvement in functional abilities.  Baseline: see above Goal status: INITIAL  2.  Patient will demonstrate 5/5 Rt knee strength to improve stability with stair negotiation  Baseline: see above Goal status: INITIAL  3.  Patient will demonstrate at least 4+/5 hip strength to improve stability with prolonged walking activity.  Baseline: see above Goal status: INITIAL  4.  Patient will report pain at worst rated as </= 5/10 to reduce current functional limitations.  Baseline: 10 Goal status: INITIAL    PLAN:  PT FREQUENCY: 2x/week  PT DURATION: 6 weeks  PLANNED INTERVENTIONS: 97164- PT Re-evaluation, 97110-Therapeutic exercises, 97530- Therapeutic activity, [id number]- Neuromuscular re-education, 97535- Self Care, [id number]- Manual therapy, [id number]- Gait training, [phone]- Aquatic Therapy, [phone]- Electrical stimulation (unattended), [id number]- Electrical stimulation (manual), 97016- Vasopneumatic device, Taping, Dry Needling, Cryotherapy, and Moist heat  PLAN FOR NEXT SESSION: Follow-up on arch taping. Review and progress HEP prn; knee and hip strengthening; knee flexion ROM, intrinsic foot strengthening    Sims Duck, PTA 01/11/24 9:04 AM

## 2024-01-16 ENCOUNTER — Ambulatory Visit

## 2024-01-16 DIAGNOSIS — M6281 Muscle weakness (generalized): Secondary | ICD-10-CM

## 2024-01-16 DIAGNOSIS — R600 Localized edema: Secondary | ICD-10-CM

## 2024-01-16 DIAGNOSIS — G8929 Other chronic pain: Secondary | ICD-10-CM

## 2024-01-16 NOTE — Therapy (Signed)
 OUTPATIENT PHYSICAL THERAPY LOWER EXTREMITY TREATMENT   Patient Name: Matthew Daniels MRN: [id number] DOB:Sep 10, 1985, 38 y.o., male Today's Date: 01/16/2024  END OF SESSION:  PT End of Session - 01/16/24 1016     Visit Number 3    Number of Visits 13    Date for PT Re-Evaluation 02/23/24    Authorization Type [facility]CD- [employer]    Authorization - Visit Number 3    Authorization - Number of Visits 27    PT Start Time 1016    PT Stop Time 1104    PT Time Calculation (min) 48 min    Activity Tolerance Patient tolerated treatment well    Behavior During Therapy WFL for tasks assessed/performed            Past Medical History:  Diagnosis Date   Migraine    History reviewed. No pertinent surgical history. Patient Active Problem List   Diagnosis Date Noted   Chronic pain of right knee 12/19/2023   Chronic migraine w/o aura w/o status migrainosus, not intractable 10/31/2017   Major depressive disorder, single episode, moderate (HCC) 10/31/2017   Dissecting cellulitis of scalp 08/15/2012    PCP: Manette Section, MD  REFERRING PROVIDER: Gean Keels, MD   REFERRING DIAG: [id number] (ICD-10-CM) - Chronic pain of right knee   THERAPY DIAG:  Chronic pain of right knee  Muscle weakness (generalized)  Localized edema  Rationale for Evaluation and Treatment: Rehabilitation  ONSET DATE: December 2023   SUBJECTIVE:   SUBJECTIVE STATEMENT: Patient reports no change in knee pain/discomfort; states he thinks the arch raping helped some.   EVAL: Patient reports the Rt knee started bothering him about 1.5 years ago. He woke up and the knee was swollen and stiff, but does not recall a MOI. It will pop and give out on him at times. He tried to strength train on his own, but then tried to run again and it was swollen and painful when he tried that. He denies any previous injury to the knee.    PERTINENT HISTORY: None   PAIN:  Are you having pain? Yes: NPRS  scale: none currently; 10 at worst Pain location: Rt anterior,lateral and posterior knee Pain description: dull, ache Aggravating factors: running, prolonged walking/standing/driving Relieving factors: rest  PRECAUTIONS: None  RED FLAGS: None   WEIGHT BEARING RESTRICTIONS: No  FALLS:  Has patient fallen in last 6 months? No  LIVING ENVIRONMENT: Lives with: lives with their family Lives in: House/apartment Stairs: Yes: Internal: flight steps; on left going up and External: 8 steps; on right going up Has following equipment at home: None  OCCUPATION: Nature conservation officer at [employer]   PLOF: Independent  PATIENT GOALS: some relief   NEXT MD VISIT: nothing scheduled   OBJECTIVE:  Note: Objective measures were completed at Evaluation unless otherwise noted.  DIAGNOSTIC FINDINGS:  MRI pending   PATIENT SURVEYS:   LEFS: 28/80  COGNITION: Overall cognitive status: Within functional limits for tasks assessed     SENSATION: Not tested  EDEMA:  Mild swelling Rt knee   MUSCLE LENGTH: Hamstrings: mild tightness bilaterally    POSTURE: pes planus, mild genu valgum  PALPATION: TTP joint line lateral > medial   LOWER EXTREMITY ROM:  Active ROM Right eval Left eval  Hip flexion    Hip extension    Hip abduction    Hip adduction    Hip internal rotation    Hip external rotation    Knee flexion 115 tight  125  Knee extension WNL WNL  Ankle dorsiflexion    Ankle plantarflexion    Ankle inversion    Ankle eversion     (Blank rows = not tested)  LOWER EXTREMITY MMT:  MMT Right eval Left eval  Hip flexion 4- 4  Hip extension 4- 4-  Hip abduction 4- 4+  Hip adduction    Hip internal rotation    Hip external rotation    Knee flexion 4+ 5  Knee extension 4 5  Ankle dorsiflexion    Ankle plantarflexion    Ankle inversion    Ankle eversion     (Blank rows = not tested)  LOWER EXTREMITY SPECIAL TESTS:  McMurray's inconclusive- patient unable to  relax Thessaly (+) for pain  Amalia Badder (+)  Thomas (+) bilateral   FUNCTIONAL TESTS:  Squat: Lt weight shift  SLS- significant arch collapse 15 seconds each   GAIT: Distance walked: 20 ft  Assistive device utilized: None Level of assistance: Complete Independence Comments: excessive pronation during stance    [facility] Adult PT Treatment:                                                DATE: 01/16/2024 Therapeutic Exercise: Side Lying: 3-way glute med leg raises x10 each position Leg over & back 2x10 --> tactile assist to stabilize pelvis Prone: Hip straight leg extension x10 Bent knee hip extension 2x10 Neuromuscular re-ed: Standing TKE with coregeous ball behind knee 10x10 Supine small range SLR 10x5 in parallel & ER  Hooklying hip abd black TB press 10x10 Seated Rt knee extension + hip add isometric green bolster squeeze 2x10 --> slow pace Quarter wall squat + green bolster hip add stability 2x5x5 6 runners step up Rt 2x5 --> bilateral railing    [facility] Adult PT Treatment:                                                DATE: 01/11/2024 Therapeutic Exercise: Side Lying: Bent knee hip abd Straight leg hip abd Leg lifts over and back Prone HS curls RTB --> GTB Manual Therapy: Arch taping  Neuromuscular re-ed: Seated hip add/abd isometrics 5x5 each Hooklying hip add/abd isometrics 5x5 each  Small range SLR (Rt) 10x5 Rt hamstring isometric 10x5 Prone hip extension straight leg x10 -> bent knee x10 Seated knee extension + isometric hold with black TB Towel scrunch   [facility] Adult PT Treatment:                                                DATE: 01/07/24 Therapeutic Exercise: Demonstrated,performed, and issued initial HEP.  Self Care: Ice for pain, swelling Activities to avoid currently including deep squatting, running, cutting     PATIENT EDUCATION:  Education details: Updated HEP Person educated: Patient Education method: Explanation, Demonstration, Tactile cues,  Verbal cues, and Handouts Education comprehension: verbalized understanding, returned demonstration, verbal cues required, tactile cues required, and needs further education  HOME EXERCISE PROGRAM: Access Code: [email or web address] URL: [email or web address] Date: 01/16/2024 Prepared by: Sims Duck  Exercises - Sidelying Hip Abduction  - 1 x daily - 7  x weekly - 3 sets - 10 reps - Supine Active Straight Leg Raise  - 1 x daily - 7 x weekly - 3 sets - 10 reps - Prone Hip Extension  - 1 x daily - 7 x weekly - 3 sets - 10 reps - Modified Thomas Stretch  - 1 x daily - 7 x weekly - 3 sets - 30 sec  hold - Prone Hip Extension with Bent Knee  - 1 x daily - 7 x weekly - 3 sets - 10 reps - Towel Scrunches  - 1 x daily - 7 x weekly - 3 sets - 10 reps - Wall Squat with Ball between Knees  - 1 x daily - 7 x weekly - 3 sets - 10 reps - 5 sec hold - Seated Long Arc Quad with Hip Adduction  - 1 x daily - 7 x weekly - 3 sets - 10 reps - 5 sec hold  ASSESSMENT:  CLINICAL IMPRESSION: Session focused on glute strengthening and adductor/medial quad activation. Fatigue noted at distal medial quad during seated and standing functional knee extension exercises. Cueing improved core stability and TA activation during prone hip extension and wall squats. Quad activation progressed from wall squats to single leg step up with noted improved quad activation and glute stability.  EVAL: Patient is a 38 y.o. male who was seen today for physical therapy evaluation and treatment for chronic Rt knee pain. Upon assessment he is noted to have joint line tenderness, positive Thessaly special test, mild limitation into knee flexion AROM, Rt knee weakness, significant hip weakness, and pes planus. He will benefit from skilled PT to address the above stated deficits in order to optimize his function and assist in overall pain reduction.   OBJECTIVE IMPAIRMENTS: Abnormal gait, decreased activity tolerance, decreased balance,  decreased knowledge of condition, decreased mobility, difficulty walking, decreased ROM, decreased strength, increased edema, impaired flexibility, postural dysfunction, and pain.   ACTIVITY LIMITATIONS: carrying, lifting, bending, sitting, standing, squatting, stairs, transfers, and locomotion level  PARTICIPATION LIMITATIONS: cleaning, driving, shopping, community activity, and occupation  PERSONAL FACTORS: Age, Fitness, Profession, Time since onset of injury/illness/exacerbation, and 1 comorbidity: see PMH above  are also affecting patient's functional outcome.   REHAB POTENTIAL: Good  CLINICAL DECISION MAKING: Stable/uncomplicated  EVALUATION COMPLEXITY: Low   GOALS: Goals reviewed with patient? Yes  SHORT TERM GOALS: Target date: 01/28/2024  Patient will be independent and compliant with initial HEP.  Baseline: issued at eval Goal status: INITIAL  2.  Patient will demonstrate at least 120 degrees of Rt knee flexion AROM to improve ability to complete squatting activity.  Baseline: see above  Goal status: INITIAL  3.  Patient will demonstrate proper squat mechanics.  Baseline: Lt weight shift  Goal status: INITIAL    LONG TERM GOALS: Target date: 02/23/24  Patient will score >/= 40/80 on the LEFS (MCID is 9) to signify clinically meaningful improvement in functional abilities.  Baseline: see above Goal status: INITIAL  2.  Patient will demonstrate 5/5 Rt knee strength to improve stability with stair negotiation  Baseline: see above Goal status: INITIAL  3.  Patient will demonstrate at least 4+/5 hip strength to improve stability with prolonged walking activity.  Baseline: see above Goal status: INITIAL  4.  Patient will report pain at worst rated as </= 5/10 to reduce current functional limitations.  Baseline: 10 Goal status: INITIAL    PLAN:  PT FREQUENCY: 2x/week  PT DURATION: 6 weeks  PLANNED INTERVENTIONS: 16109-  PT Re-evaluation, 97110-Therapeutic  exercises, 97530- Therapeutic activity, [id number]- Neuromuscular re-education, [id number]- Self Care, 16109- Manual therapy, [id number]- Gait training, [phone]- Aquatic Therapy, [phone]- Electrical stimulation (unattended), [id number]- Electrical stimulation (manual), 97016- Vasopneumatic device, Taping, Dry Needling, Cryotherapy, and Moist heat  PLAN FOR NEXT SESSION: Print VMO leg raise/add glute med strengthening - HEP. Review and progress HEP prn; knee and hip strengthening; knee flexion ROM, intrinsic foot strengthening    Sims Duck, PTA 01/16/24 11:05 AM

## 2024-01-18 ENCOUNTER — Ambulatory Visit

## 2024-01-18 DIAGNOSIS — M6281 Muscle weakness (generalized): Secondary | ICD-10-CM

## 2024-01-18 DIAGNOSIS — G8929 Other chronic pain: Secondary | ICD-10-CM

## 2024-01-18 DIAGNOSIS — R600 Localized edema: Secondary | ICD-10-CM

## 2024-01-18 NOTE — Therapy (Signed)
 OUTPATIENT PHYSICAL THERAPY LOWER EXTREMITY TREATMENT   Patient Name: Matthew Daniels MRN: [id number] DOB:[birth year], 38 y.o., male Today's Date: 01/18/2024  END OF SESSION:  PT End of Session - 01/18/24 1020     Visit Number 4    Number of Visits 13    Date for PT Re-Evaluation 02/23/24    Authorization Type [facility]- [employer]    Authorization - Visit Number 4    Authorization - Number of Visits 27    PT Start Time 1020    PT Stop Time 1105    PT Time Calculation (min) 45 min    Activity Tolerance Patient tolerated treatment well    Behavior During Therapy WFL for tasks assessed/performed         Past Medical History:  Diagnosis Date   Migraine    History reviewed. No pertinent surgical history. Patient Active Problem List   Diagnosis Date Noted   Chronic pain of right knee 12/19/2023   Chronic migraine w/o aura w/o status migrainosus, not intractable 10/31/2017   Major depressive disorder, single episode, moderate (HCC) 10/31/2017   Dissecting cellulitis of scalp 08/15/2012    PCP: Manette Section, MD  REFERRING PROVIDER: Gean Keels, MD   REFERRING DIAG: [phone] (ICD-10-CM) - Chronic pain of right knee   THERAPY DIAG:  Chronic pain of right knee  Localized edema  Muscle weakness (generalized)  Rationale for Evaluation and Treatment: Rehabilitation  ONSET DATE: December 2023   SUBJECTIVE:   SUBJECTIVE STATEMENT: Patient reports he felt fine after last visit, states he continues to feel weak on inside of knee.    PERTINENT HISTORY: None   PAIN:  Are you having pain? Yes: NPRS scale: none currently; 10 at worst Pain location: Rt anterior,lateral and posterior knee Pain description: dull, ache Aggravating factors: running, prolonged walking/standing/driving Relieving factors: rest  PRECAUTIONS: None  RED FLAGS: None   WEIGHT BEARING RESTRICTIONS: No  FALLS:  Has patient fallen in last 6 months? No  LIVING  ENVIRONMENT: Lives with: lives with their family Lives in: House/apartment Stairs: Yes: Internal: flight steps; on left going up and External: 8 steps; on right going up Has following equipment at home: None  OCCUPATION: Nature conservation officer at [employer]   PLOF: Independent  PATIENT GOALS: some relief   NEXT MD VISIT: nothing scheduled   OBJECTIVE:  Note: Objective measures were completed at Evaluation unless otherwise noted.  DIAGNOSTIC FINDINGS:  MRI pending   PATIENT SURVEYS:   LEFS: 28/80  COGNITION: Overall cognitive status: Within functional limits for tasks assessed     SENSATION: Not tested  EDEMA:  Mild swelling Rt knee   MUSCLE LENGTH: Hamstrings: mild tightness bilaterally    POSTURE: pes planus, mild genu valgum  PALPATION: TTP joint line lateral > medial   LOWER EXTREMITY ROM:  Active ROM Right eval Left eval  Hip flexion    Hip extension    Hip abduction    Hip adduction    Hip internal rotation    Hip external rotation    Knee flexion 115 tight  125  Knee extension WNL WNL  Ankle dorsiflexion    Ankle plantarflexion    Ankle inversion    Ankle eversion     (Blank rows = not tested)  LOWER EXTREMITY MMT:  MMT Right eval Left eval  Hip flexion 4- 4  Hip extension 4- 4-  Hip abduction 4- 4+  Hip adduction    Hip internal rotation    Hip external rotation  Knee flexion 4+ 5  Knee extension 4 5  Ankle dorsiflexion    Ankle plantarflexion    Ankle inversion    Ankle eversion     (Blank rows = not tested)  LOWER EXTREMITY SPECIAL TESTS:  McMurray's inconclusive- patient unable to relax Thessaly (+) for pain  Amalia Badder (+)  Thomas (+) bilateral   FUNCTIONAL TESTS:  Squat: Lt weight shift  SLS- significant arch collapse 15 seconds each   GAIT: Distance walked: 20 ft  Assistive device utilized: None Level of assistance: Complete Independence Comments: excessive pronation during stance    [facility] Adult PT Treatment:                                                 DATE: 01/18/2024 Therapeutic Exercise: Recumbent bike L3 x 5 min Bridges + hip abd iso with black TB x10 Supine bent knee ITB stretch with strap 2x30 --> bending knee felt more stretch at lateral thigh Neuromuscular re-ed: Small range straight leg raise in ER + 3#AW placed above knee 10x3 Hooklying hip add isometric + green bolster squeeze 10x5 Hooklying hip abd black TB press 5x10 S/L 3-way hip abd leg raise in IR x10 each position 90/90 heel taps Dead bug legs --> progressed to full dead bug exercise    [facility] Adult PT Treatment:                                                DATE: 01/16/2024 Therapeutic Exercise: Side Lying: 3-way glute med leg raises x10 each position Leg over & back 2x10 --> tactile assist to stabilize pelvis Prone: Hip straight leg extension x10 Bent knee hip extension 2x10 Neuromuscular re-ed: Standing TKE with coregeous ball behind knee 10x10 Supine small range SLR 10x5 in parallel & ER  Hooklying hip abd black TB press 10x10 Seated Rt knee extension + hip add isometric green bolster squeeze 2x10 --> slow pace Quarter wall squat + green bolster hip add stability 2x5x5 6 runners step up Rt 2x5 --> bilateral railing    [facility] Adult PT Treatment:                                                DATE: 01/11/2024 Therapeutic Exercise: Side Lying: Bent knee hip abd Straight leg hip abd Leg lifts over and back Prone HS curls RTB --> GTB Manual Therapy: Arch taping  Neuromuscular re-ed: Seated hip add/abd isometrics 5x5 each Hooklying hip add/abd isometrics 5x5 each  Small range SLR (Rt) 10x5 Rt hamstring isometric 10x5 Prone hip extension straight leg x10 -> bent knee x10 Seated knee extension + isometric hold with black TB Towel scrunch   PATIENT EDUCATION:  Education details: Updated HEP Person educated: Patient Education method: Explanation, Demonstration, Tactile cues, Verbal cues, and  Handouts Education comprehension: verbalized understanding, returned demonstration, verbal cues required, tactile cues required, and needs further education  HOME EXERCISE PROGRAM: Access Code: [email or web address] URL: [email or web address] Date: 01/18/2024 Prepared by: Sims Duck  Exercises - Sidelying Hip Abduction  - 1 x daily - 7 x weekly - 3 sets - 10 reps -  Supine Active Straight Leg Raise  - 1 x daily - 7 x weekly - 3 sets - 10 reps - Prone Hip Extension  - 1 x daily - 7 x weekly - 3 sets - 10 reps - Modified Thomas Stretch  - 1 x daily - 7 x weekly - 3 sets - 30 sec  hold - Prone Hip Extension with Bent Knee  - 1 x daily - 7 x weekly - 3 sets - 10 reps - Towel Scrunches  - 1 x daily - 7 x weekly - 3 sets - 10 reps - Wall Squat with Ball between Knees  - 1 x daily - 7 x weekly - 3 sets - 10 reps - 5 sec hold - Seated Long Arc Quad with Hip Adduction  - 1 x daily - 7 x weekly - 3 sets - 10 reps - 5 sec hold - Straight Leg Raise with External Rotation  - 1 x daily - 7 x weekly - 3 sets - 10 reps - 5 sec hold - Supine Dead Bug with Leg Extension  - 1 x daily - 7 x weekly - 3 sets - 10 reps - Supine 90/90 Alternating Heel Touches with Posterior Pelvic Tilt  - 1 x daily - 7 x weekly - 3 sets - 10 reps  ASSESSMENT:  CLINICAL IMPRESSION: Core strengthening added to address weak TA activation; cueing provided to maintain trunk stability and TA engagement. Glute and quad strengthening continued with no exacerbation of knee pain. Recommended patient begin walking program to assess response in knee for increased swelling.   EVAL: Patient is a 38 y.o. male who was seen today for physical therapy evaluation and treatment for chronic Rt knee pain. Upon assessment he is noted to have joint line tenderness, positive Thessaly special test, mild limitation into knee flexion AROM, Rt knee weakness, significant hip weakness, and pes planus. He will benefit from skilled PT to address the above  stated deficits in order to optimize his function and assist in overall pain reduction.   OBJECTIVE IMPAIRMENTS: Abnormal gait, decreased activity tolerance, decreased balance, decreased knowledge of condition, decreased mobility, difficulty walking, decreased ROM, decreased strength, increased edema, impaired flexibility, postural dysfunction, and pain.   ACTIVITY LIMITATIONS: carrying, lifting, bending, sitting, standing, squatting, stairs, transfers, and locomotion level  PARTICIPATION LIMITATIONS: cleaning, driving, shopping, community activity, and occupation  PERSONAL FACTORS: Age, Fitness, Profession, Time since onset of injury/illness/exacerbation, and 1 comorbidity: see PMH above  are also affecting patient's functional outcome.   REHAB POTENTIAL: Good  CLINICAL DECISION MAKING: Stable/uncomplicated  EVALUATION COMPLEXITY: Low   GOALS: Goals reviewed with patient? Yes  SHORT TERM GOALS: Target date: 01/28/2024  Patient will be independent and compliant with initial HEP.  Baseline: issued at eval Goal status: INITIAL  2.  Patient will demonstrate at least 120 degrees of Rt knee flexion AROM to improve ability to complete squatting activity.  Baseline: see above  Goal status: INITIAL  3.  Patient will demonstrate proper squat mechanics.  Baseline: Lt weight shift  Goal status: INITIAL    LONG TERM GOALS: Target date: 02/23/24  Patient will score >/= 40/80 on the LEFS (MCID is 9) to signify clinically meaningful improvement in functional abilities.  Baseline: see above Goal status: INITIAL  2.  Patient will demonstrate 5/5 Rt knee strength to improve stability with stair negotiation  Baseline: see above Goal status: INITIAL  3.  Patient will demonstrate at least 4+/5 hip strength to improve stability with prolonged  walking activity.  Baseline: see above Goal status: INITIAL  4.  Patient will report pain at worst rated as </= 5/10 to reduce current functional  limitations.  Baseline: 10 Goal status: INITIAL    PLAN:  PT FREQUENCY: 2x/week  PT DURATION: 6 weeks  PLANNED INTERVENTIONS: 97164- PT Re-evaluation, 97110-Therapeutic exercises, 97530- Therapeutic activity, [id number]- Neuromuscular re-education, 97535- Self Care, 56213- Manual therapy, [id number]- Gait training, [phone]- Aquatic Therapy, [phone]- Electrical stimulation (unattended), [phone]- Electrical stimulation (manual), 97016- Vasopneumatic device, Taping, Dry Needling, Cryotherapy, and Moist heat  PLAN FOR NEXT SESSION: Review and progress HEP prn; knee/hip/core strengthening; knee flexion ROM, intrinsic foot strengthening    Sims Duck, PTA 01/18/24 11:18 AM

## 2024-01-21 NOTE — Progress Notes (Signed)
 scheduled

## 2024-01-22 ENCOUNTER — Ambulatory Visit: Admitting: Sports Medicine

## 2024-01-22 ENCOUNTER — Other Ambulatory Visit (INDEPENDENT_AMBULATORY_CARE_PROVIDER_SITE_OTHER)

## 2024-01-22 ENCOUNTER — Ambulatory Visit

## 2024-01-22 DIAGNOSIS — G8929 Other chronic pain: Secondary | ICD-10-CM

## 2024-01-22 DIAGNOSIS — M25561 Pain in right knee: Secondary | ICD-10-CM | POA: Diagnosis not present

## 2024-01-22 MED ORDER — TRIAMCINOLONE ACETONIDE 40 MG/ML IJ SUSP
40.0000 mg | Freq: Once | INTRAMUSCULAR | Status: AC
  Administered 2024-01-22: 40 mg via INTRA_ARTICULAR

## 2024-01-22 NOTE — Progress Notes (Addendum)
    Procedures performed today:    Procedure: Real-time Ultrasound Guided injection of the right knee Device: Samsung HS60  Verbal informed consent obtained.  Time-out conducted.  Noted no overlying erythema, induration, or other signs of local infection.  Skin prepped in a sterile fashion.  Local anesthesia: Topical Ethyl chloride.  With sterile technique and under real time ultrasound guidance: No noted effusion, 1 cc Kenalog 40, 2 cc lidocaine, 2 cc bupivacaine injected easily Completed without difficulty  Advised to call if fevers/chills, erythema, induration, drainage, or persistent bleeding.  Images permanently stored and available for review in PACS.  Impression: Technically successful ultrasound guided injection.  Independent interpretation of notes and tests performed by another provider:   None.  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 Ultimately MRI did show mostly osteoarthritis without meniscal tearing or other internal derangements, oral analgesics not effective so we injected his knee today, return to see me in 6 weeks, viscosupplementation if not better.    ____________________________________________ Joselyn Nicely. Sandy Crumb, M.D., ABFM., CAQSM., AME. Primary Care and Sports Medicine [facility]  Adjunct Professor of [facility] Medicine  [employer]  Restaurant manager, fast food

## 2024-01-22 NOTE — Assessment & Plan Note (Addendum)
 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 Ultimately MRI did show mostly osteoarthritis without meniscal tearing or other internal derangements, oral analgesics not effective so we injected his knee today, return to see me in 6 weeks, viscosupplementation if not better.

## 2024-01-22 NOTE — Addendum Note (Signed)
 Addended by: Montgomery Apgar on: 01/22/2024 11:14 AM   Modules accepted: Orders

## 2024-01-24 ENCOUNTER — Ambulatory Visit

## 2024-01-24 DIAGNOSIS — G8929 Other chronic pain: Secondary | ICD-10-CM

## 2024-01-24 DIAGNOSIS — R600 Localized edema: Secondary | ICD-10-CM

## 2024-01-24 DIAGNOSIS — M6281 Muscle weakness (generalized): Secondary | ICD-10-CM

## 2024-01-24 NOTE — Therapy (Addendum)
 OUTPATIENT PHYSICAL THERAPY LOWER EXTREMITY TREATMENT  PHYSICAL THERAPY DISCHARGE SUMMARY  Visits from Start of Care: 5  Current functional level related to goals / functional outcomes: See goals below   Remaining deficits: Status unknown   Education / Equipment: N/A   Patient agrees to discharge. Patient goals were partially met. Patient is being discharged due to not returning since the last visit.  Patient Name: Matthew Daniels MRN: [id number] DOB:1985-09-06, 38 y.o., male Today's Date: 01/24/2024  END OF SESSION:  PT End of Session - 01/24/24 0936     Visit Number 5    Number of Visits 13    Date for PT Re-Evaluation 02/23/24    Authorization Type MCD- [employer]    Authorization - Visit Number 5    Authorization - Number of Visits 27    PT Start Time [phone]    PT Stop Time 1015    PT Time Calculation (min) 39 min    Activity Tolerance Patient tolerated treatment well    Behavior During Therapy WFL for tasks assessed/performed          Past Medical History:  Diagnosis Date   Migraine    History reviewed. No pertinent surgical history. Patient Active Problem List   Diagnosis Date Noted   Chronic pain of right knee 12/19/2023   Chronic migraine w/o aura w/o status migrainosus, not intractable 10/31/2017   Major depressive disorder, single episode, moderate (HCC) 10/31/2017   Dissecting cellulitis of scalp 08/15/2012    PCP: Colette Torrence GRADE, MD  REFERRING PROVIDER: Curtis Debby PARAS, MD   REFERRING DIAG: [phone] (ICD-10-CM) - Chronic pain of right knee   THERAPY DIAG:  Chronic pain of right knee  Localized edema  Muscle weakness (generalized)  Rationale for Evaluation and Treatment: Rehabilitation  ONSET DATE: December 2023   SUBJECTIVE:   SUBJECTIVE STATEMENT: Patient had f/u with Dr. ONEIDA on Tuesday and received an injection. Feels like the exercises have helped more than the injection. Feels like the knee has more range after the  injection.     PERTINENT HISTORY: None   PAIN:  Are you having pain? Yes: NPRS scale: none currently;8 at worst Pain location: Rt anterior,lateral and posterior knee Pain description: discomfort, swollen Aggravating factors: running, prolonged walking/standing/driving Relieving factors: rest  PRECAUTIONS: None  RED FLAGS: None   WEIGHT BEARING RESTRICTIONS: No  FALLS:  Has patient fallen in last 6 months? 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  DIAGNOSTIC FINDINGS:  MRI pending   PATIENT SURVEYS:   LEFS: 28/80  COGNITION: Overall cognitive status: Within functional limits for tasks assessed     SENSATION: Not tested  EDEMA:  Mild swelling Rt knee   MUSCLE LENGTH: Hamstrings: mild tightness bilaterally    POSTURE: pes planus, mild genu valgum  PALPATION: TTP joint line lateral > medial   LOWER EXTREMITY ROM:  Active ROM Right eval Left eval 01/24/24 Right   Hip flexion     Hip extension     Hip abduction     Hip adduction     Hip internal rotation     Hip external rotation  Knee flexion 115 tight  125 128  Knee extension WNL WNL   Ankle dorsiflexion     Ankle plantarflexion     Ankle inversion     Ankle eversion      (Blank rows = not tested)  LOWER EXTREMITY MMT:  MMT Right eval Left eval  Hip flexion 4- 4  Hip extension 4- 4-  Hip abduction 4- 4+  Hip adduction    Hip internal rotation    Hip external rotation    Knee flexion 4+ 5  Knee extension 4 5  Ankle dorsiflexion    Ankle plantarflexion    Ankle inversion     Ankle eversion     (Blank rows = not tested)  LOWER EXTREMITY SPECIAL TESTS:  McMurray's inconclusive- patient unable to relax Thessaly (+) for pain  Lorrene (+)  Thomas (+) bilateral   FUNCTIONAL TESTS:  Squat: Lt weight shift  SLS- significant arch collapse 15 seconds each   GAIT: Distance walked: 20 ft  Assistive device utilized: None Level of assistance: Complete Independence Comments: excessive pronation during stance   [facility] Adult PT Treatment:                                                DATE: 01/24/24 Therapeutic Exercise: Recumbent bike level 3 x 5 minutes HS stretch with strap x 30 sec each   Neuromuscular re-ed: 3 way hip 2 x 10 each  Bridge with knee extension 2 x 10  SLR x 10 RLE  Therapeutic Activity: Eccentric leg press 2 x 10 @ 120 lbs  Wall squats 2 x 10    [facility] Adult PT Treatment:                                                DATE: 01/18/2024 Therapeutic Exercise: Recumbent bike L3 x 5 min Bridges + hip abd iso with black TB x10 Supine bent knee ITB stretch with strap 2x30 --> bending knee felt more stretch at lateral thigh Neuromuscular re-ed: Small range straight leg raise in ER + 3#AW placed above knee 10x3 Hooklying hip add isometric + green bolster squeeze 10x5 Hooklying hip abd black TB press 5x10 S/L 3-way hip abd leg raise in IR x10 each position 90/90 heel taps Dead bug legs --> progressed to full dead bug exercise    [facility] Adult PT Treatment:                                                DATE: 01/16/2024 Therapeutic Exercise: Side Lying: 3-way glute med leg raises x10 each position Leg over & back 2x10 --> tactile assist to stabilize pelvis Prone: Hip straight leg extension x10 Bent knee hip extension 2x10 Neuromuscular re-ed: Standing TKE with coregeous ball behind knee 10x10 Supine small range SLR 10x5 in parallel & ER  Hooklying hip abd black TB press 10x10 Seated Rt knee extension + hip add isometric green bolster squeeze  2x10 --> slow pace Quarter wall squat + green bolster hip add stability 2x5x5 6 runners step up Rt 2x5 --> bilateral railing  [facility] Adult PT Treatment:                                                DATE: 01/11/2024 Therapeutic Exercise: Side Lying: Bent knee hip abd Straight leg hip abd Leg lifts over and back Prone HS curls RTB --> GTB Manual Therapy: Arch taping  Neuromuscular re-ed: Seated hip add/abd isometrics 5x5 each Hooklying hip add/abd isometrics 5x5 each  Small range SLR (Rt) 10x5 Rt hamstring isometric 10x5 Prone hip extension straight leg x10 -> bent knee x10 Seated knee extension + isometric hold with black TB Towel scrunch   PATIENT EDUCATION:  Education details: HEP review  Person educated: Patient Education method: Explanation Education comprehension: verbalized understanding  HOME EXERCISE PROGRAM: Access Code: [email or web address] URL: [email or web address] Date: 01/18/2024 Prepared by: Lamarr Price  Exercises - Sidelying Hip Abduction  - 1 x daily - 7 x weekly - 3 sets - 10 reps - Supine Active Straight Leg Raise  - 1 x daily - 7 x weekly - 3 sets - 10 reps - Prone Hip Extension  - 1 x daily - 7 x weekly - 3 sets - 10 reps - Modified Thomas Stretch  - 1 x daily - 7 x weekly - 3 sets - 30 sec  hold - Prone Hip Extension with Bent Knee  - 1 x daily - 7 x weekly - 3 sets - 10 reps - Towel Scrunches  - 1 x daily - 7 x weekly - 3 sets - 10 reps - Wall Squat with Ball between Knees  - 1 x daily - 7 x weekly - 3 sets - 10 reps - 5 sec hold - Seated Long Arc Quad with Hip Adduction  - 1 x daily - 7 x weekly - 3 sets - 10 reps - 5 sec hold - Straight Leg Raise with External Rotation  - 1 x daily - 7 x weekly - 3 sets - 10 reps - 5 sec hold - Supine Dead Bug with Leg Extension  - 1 x daily - 7 x weekly - 3 sets - 10 reps - Supine 90/90 Alternating Heel Touches with Posterior Pelvic Tilt  - 1 x daily - 7 x weekly - 3 sets - 10  reps  ASSESSMENT:  CLINICAL IMPRESSION: Rt knee flexion AROM has improved having met this STG. Focused on progression of LE CKC strengthening without onset of knee pain. Good control and form noted with eccentric leg press. With wall squat he has initial Lt hip shift during ascent, but is able to correct with cues. With wall squats he notes weakness on the RLE compared to LLE, but no onset of pain.   EVAL: Patient is a 38 y.o. male who was seen today for physical therapy evaluation and treatment for chronic Rt knee pain. Upon assessment he is noted to have joint line tenderness, positive Thessaly special test, mild limitation into knee flexion AROM, Rt knee weakness, significant hip weakness, and pes planus. He will benefit from skilled PT to address the above stated deficits in order to optimize his function and assist in overall pain reduction.   OBJECTIVE IMPAIRMENTS: Abnormal gait, decreased activity tolerance, decreased balance, decreased knowledge of condition, decreased mobility, difficulty walking, decreased ROM, decreased strength, increased edema, impaired flexibility, postural dysfunction, and pain.   ACTIVITY LIMITATIONS: carrying, lifting, bending, sitting, standing, squatting, stairs,  transfers, and locomotion level  PARTICIPATION LIMITATIONS: cleaning, driving, shopping, community activity, and occupation  PERSONAL FACTORS: Age, Fitness, Profession, Time since onset of injury/illness/exacerbation, and 1 comorbidity: see PMH above  are also affecting patient's functional outcome.   REHAB POTENTIAL: Good  CLINICAL DECISION MAKING: Stable/uncomplicated  EVALUATION COMPLEXITY: Low   GOALS: Goals reviewed with patient? Yes  SHORT TERM GOALS: Target date: 01/28/2024  Patient will be independent and compliant with initial HEP.  Baseline: issued at eval Goal status: MET  2.  Patient will demonstrate at least 120 degrees of Rt knee flexion AROM to improve ability to complete  squatting activity.  Baseline: see above  Goal status: MET  3.  Patient will demonstrate proper squat mechanics.  Baseline: Lt weight shift  Goal status: INITIAL    LONG TERM GOALS: Target date: 02/23/24  Patient will score >/= 40/80 on the LEFS (MCID is 9) to signify clinically meaningful improvement in functional abilities.  Baseline: see above Goal status: INITIAL  2.  Patient will demonstrate 5/5 Rt knee strength to improve stability with stair negotiation  Baseline: see above Goal status: INITIAL  3.  Patient will demonstrate at least 4+/5 hip strength to improve stability with prolonged walking activity.  Baseline: see above Goal status: INITIAL  4.  Patient will report pain at worst rated as </= 5/10 to reduce current functional limitations.  Baseline: 10 Goal status: INITIAL    PLAN:  PT FREQUENCY: 2x/week  PT DURATION: 6 weeks  PLANNED INTERVENTIONS: 97164- PT Re-evaluation, 97110-Therapeutic exercises, 97530- Therapeutic activity, [id number]- Neuromuscular re-education, 97535- Self Care, [id number]- Manual therapy, [id number]- Gait training, [id number]- Aquatic Therapy, [id number]- Electrical stimulation (unattended), [id number]- Electrical stimulation (manual), 97016- Vasopneumatic device, Taping, Dry Needling, Cryotherapy, and Moist heat     Lucie Meeter, PT, DPT, ATC 01/24/24 10:17 AM  Dayleen Beske, PT, DPT, ATC 07/08/24 12:22 PM

## 2024-01-29 ENCOUNTER — Encounter

## 2024-02-05 ENCOUNTER — Encounter

## 2024-02-06 ENCOUNTER — Other Ambulatory Visit: Payer: Self-pay | Admitting: Family Medicine

## 2024-02-06 DIAGNOSIS — G43709 Chronic migraine without aura, not intractable, without status migrainosus: Secondary | ICD-10-CM

## 2024-03-04 ENCOUNTER — Ambulatory Visit: Admitting: Sports Medicine

## 2024-03-04 ENCOUNTER — Ambulatory Visit

## 2024-03-04 ENCOUNTER — Encounter: Payer: Self-pay | Admitting: Sports Medicine

## 2024-03-04 DIAGNOSIS — M25572 Pain in left ankle and joints of left foot: Secondary | ICD-10-CM

## 2024-03-04 DIAGNOSIS — M25571 Pain in right ankle and joints of right foot: Secondary | ICD-10-CM | POA: Insufficient documentation

## 2024-03-04 DIAGNOSIS — G8929 Other chronic pain: Secondary | ICD-10-CM

## 2024-03-04 DIAGNOSIS — M25561 Pain in right knee: Secondary | ICD-10-CM

## 2024-03-04 NOTE — Progress Notes (Signed)
    Procedures performed today:    None.  Independent interpretation of notes and tests performed by another provider:   None.  Brief History, Exam, Impression, and Recommendations:    Bilateral ankle pain This is a pleasant 38 year old male, he has bilateral ankle pain localized medially, he localizes the pain behind the medial malleolus as well as along the course to the navicular prominence. He wears flip-flops. Worse with weightbearing. On exam he also has significant pes planus He has tenderness at the navicular prominence as well as along the entire course of the tibialis posterior. He does have a positive to many toes sign when viewed from behind. Discussed the anatomy and pathophysiology, he will need physical therapy, x-rays, custom molded orthotics with an aggressive arch. He will need to wear a supportive shoe daily. Return to see me after 6 weeks of all of the above.  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 We can always consider viscosupplementation if pain returns.    ____________________________________________ Debby PARAS. Curtis, M.D., ABFM., CAQSM., AME. Primary Care and Sports Medicine [facility]  Adjunct Professor of [facility] Medicine  [employer]  Restaurant manager, fast food

## 2024-03-04 NOTE — Assessment & Plan Note (Signed)
 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 We can always consider viscosupplementation if pain returns.

## 2024-03-04 NOTE — Assessment & Plan Note (Signed)
 This is a pleasant 38 year old male, he has bilateral ankle pain localized medially, he localizes the pain behind the medial malleolus as well as along the course to the navicular prominence. He wears flip-flops. Worse with weightbearing. On exam he also has significant pes planus He has tenderness at the navicular prominence as well as along the entire course of the tibialis posterior. He does have a positive to many toes sign when viewed from behind. Discussed the anatomy and pathophysiology, he will need physical therapy, x-rays, custom molded orthotics with an aggressive arch. He will need to wear a supportive shoe daily. Return to see me after 6 weeks of all of the above.

## 2024-03-05 ENCOUNTER — Ambulatory Visit: Admitting: Family Medicine

## 2024-03-09 ENCOUNTER — Ambulatory Visit: Payer: Self-pay | Admitting: Sports Medicine

## 2024-03-12 ENCOUNTER — Telehealth: Payer: Self-pay

## 2024-03-12 NOTE — Telephone Encounter (Signed)
 Called patient, no answer, lvm for patient to call back to schedule an appointment as soon as possible for an follow up.

## 2024-03-12 NOTE — Telephone Encounter (Signed)
 Reviewed clearance form. Pt with elevated blood pressures in their office. Will need to make an appointment with me to follow up on this asap before he can be cleared. Please reach out to pt to schedule.

## 2024-03-12 NOTE — Telephone Encounter (Signed)
 Patient dropped off paperwork for a medical clearance for dental treatment. Paperwork placed in work [employer]/film/video.

## 2024-03-14 ENCOUNTER — Ambulatory Visit: Admitting: Family Medicine

## 2024-03-14 ENCOUNTER — Encounter: Admitting: Family Medicine

## 2024-03-14 ENCOUNTER — Encounter: Payer: Self-pay | Admitting: Family Medicine

## 2024-03-14 VITALS — BP 138/88 | HR 77 | Temp 97.6°F | Resp 18 | Ht 75.0 in | Wt 291.7 lb

## 2024-03-14 DIAGNOSIS — R030 Elevated blood-pressure reading, without diagnosis of hypertension: Secondary | ICD-10-CM

## 2024-03-14 DIAGNOSIS — G478 Other sleep disorders: Secondary | ICD-10-CM | POA: Diagnosis not present

## 2024-03-14 DIAGNOSIS — G4709 Other insomnia: Secondary | ICD-10-CM | POA: Diagnosis not present

## 2024-03-14 DIAGNOSIS — R600 Localized edema: Secondary | ICD-10-CM | POA: Diagnosis not present

## 2024-03-14 DIAGNOSIS — Z1329 Encounter for screening for other suspected endocrine disorder: Secondary | ICD-10-CM

## 2024-03-14 DIAGNOSIS — M7989 Other specified soft tissue disorders: Secondary | ICD-10-CM

## 2024-03-14 MED ORDER — SPIRONOLACTONE 25 MG PO TABS: 25.0000 mg | ORAL_TABLET | Freq: Every day | ORAL | 0 refills | Status: DC

## 2024-03-14 NOTE — Progress Notes (Signed)
 Established Patient Office Visit  Subjective   Patient ID: Matthew Daniels, male    DOB: 28-Nov-1985  Age: 38 y.o. MRN: [id number]  Chief Complaint  Patient presents with   Follow-up    Patient is here for a 3 month follow up, he would also like to discuss ongoing insomnia, depression, and bilateral ankle swelling    HPI  3 month follow up Pt reports he's had bilateral feet swelling for the 2 weeks. He has seen Dr T for this and was told he has arthritis. He had x-rays of his ankle which was normal. He does report starting a new job in the last 2 months and having to stand on concrete floors a lot. He has added cushions in his shoes. He does report the pain in his feet is worse after work day and in the morning. Ibuprofen  does help some.   Insomnia Pt has had problems with sleep at night. Tried Melatonin at night and hasn't helped. He reports sometimes he wakes up, but unable to move and get out of bed. This lasts for about 5 mins. He says it doesn't happen every night. This has occurred since his teenager years. Pt reports some snoring at times by his wife.   Elevated blood pressure Pt needs dental work and was seen by his dentist who faxed me a report for elevated Bps in the office. He needs medical clearance before the procedure can be done. He says he needs some fillings.   Review of Systems  Cardiovascular:  Positive for leg swelling.  Musculoskeletal:  Positive for joint pain.  All other systems reviewed and are negative.     Objective:     There were no vitals taken for this visit. BP Readings from Last 3 Encounters:  03/14/24 138/88  12/05/23 138/86  10/24/23 134/88      Physical Exam Vitals and nursing note reviewed.  Constitutional:      Appearance: Normal appearance. He is obese.  HENT:     Head: Normocephalic and atraumatic.     Right Ear: External ear normal.     Left Ear: External ear normal.     Nose: Nose normal.     Mouth/Throat:     Mouth: Mucous  membranes are moist.     Pharynx: Oropharynx is clear.  Eyes:     Conjunctiva/sclera: Conjunctivae normal.     Pupils: Pupils are equal, round, and reactive to light.  Cardiovascular:     Rate and Rhythm: Normal rate and regular rhythm.     Pulses: Normal pulses.     Heart sounds: Normal heart sounds.  Pulmonary:     Effort: Pulmonary effort is normal.     Breath sounds: Normal breath sounds.  Abdominal:     General: Abdomen is flat. Bowel sounds are normal.  Musculoskeletal:        General: Swelling present.     Comments: Bilateral ankle and feet swelling +2  Skin:    General: Skin is warm.     Capillary Refill: Capillary refill takes less than 2 seconds.  Neurological:     General: No focal deficit present.     Mental Status: He is alert and oriented to person, place, and time. Mental status is at baseline.  Psychiatric:        Mood and Affect: Mood normal.        Behavior: Behavior normal.        Thought Content: Thought content normal.  Judgment: Judgment normal.     No results found for any visits on 03/14/24.     The ASCVD Risk score (Arnett DK, et al., 2019) failed to calculate for the following reasons:   The 2019 ASCVD risk score is only valid for ages 42 to 52    Assessment & Plan:   Problem List Items Addressed This Visit   None  Peripheral edema -     Brain natriuretic peptide -     TSH + free T4 -     Spironolactone ; Take 1 tablet (25 mg total) by mouth daily.  Dispense: 30 tablet; Refill: 0  Elevated blood pressure reading -     Spironolactone ; Take 1 tablet (25 mg total) by mouth daily.  Dispense: 30 tablet; Refill: 0  Sleep paralysis -     Ambulatory referral to Sleep Studies  Other insomnia -     Ambulatory referral to Sleep Studies  Screening for thyroid disorder -     TSH + free T4  Foot swelling -     Uric acid   Pt with elevated blood pressure reading despite normal bp today with feet swelling. Trial of Aldactone  25mg  daily  for now and see back in 2 weeks. Have pt monitor blood pressures and bring log in 2 weeks. Screening BNP, thyroid and gout due to foot swelling. 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 Due  No follow-ups on file.    Torrence CINDERELLA Barrier, MD

## 2024-03-15 LAB — BRAIN NATRIURETIC PEPTIDE: BNP: 20.3 pg/mL (ref 0.0–100.0)

## 2024-03-15 LAB — TSH+FREE T4
Free T4: 1.25 ng/dL (ref 0.82–1.77)
TSH: 2.63 u[IU]/mL (ref 0.450–4.500)

## 2024-03-15 LAB — URIC ACID: Uric Acid: 4.4 mg/dL (ref 3.8–8.4)

## 2024-03-17 ENCOUNTER — Ambulatory Visit: Payer: Self-pay | Admitting: Family Medicine

## 2024-03-19 ENCOUNTER — Encounter

## 2024-03-19 NOTE — Progress Notes (Deleted)
  PCP: Colette Torrence GRADE, MD  Subjective:   HPI: Patient is a 38 y.o. male here for ***.  Past Medical History:  Diagnosis Date   Migraine     Current Outpatient Medications on File Prior to Visit  Medication Sig Dispense Refill   butalbital -acetaminophen -caffeine  (FIORICET) 50-325-40 MG tablet TAKE 1 TABLET BY MOUTH EVERY 6 HOURS AS NEEDED FOR HEADACHE. 14 tablet 3   fluticasone  (FLONASE ) 50 MCG/ACT nasal spray Place 2 sprays into both nostrils daily. 18.2 mL 2   spironolactone  (ALDACTONE ) 25 MG tablet Take 1 tablet (25 mg total) by mouth daily. 30 tablet 0   SUMAtriptan  (IMITREX ) 50 MG tablet TAKE 1 TABLET EVERY 2 HOURS AS NEEDED FOR MIGRAINE. MAY REPEAT IN 2 HRS IF HEADACHE PERSISTS/RECURS 10 tablet 2   No current facility-administered medications on file prior to visit.    There were no vitals taken for this visit.       Objective:   Physical Exam:  Gen: NAD, comfortable in exam room  Inspection: *** Palpation: *** ROM: *** Special Tests: *** Neuro: ***  Assessment/Plan:   Matthew Daniels is a 38 y.o. male who was seen today for the following: There are no diagnoses linked to this encounter.   Follow-up/Education:   No follow-ups on file.   May return sooner as needed and encouraged to call/e-mail for additional questions or  worsening symptoms in the interim.  Krystal Lowing, DO Sports Medicine Fellow 03/19/2024 8:13 AM

## 2024-03-26 ENCOUNTER — Encounter

## 2024-03-27 ENCOUNTER — Encounter

## 2024-03-28 ENCOUNTER — Ambulatory Visit: Admitting: Family Medicine

## 2024-03-28 ENCOUNTER — Encounter: Payer: Self-pay | Admitting: Family Medicine

## 2024-03-28 VITALS — BP 143/94 | HR 84 | Temp 98.0°F | Resp 18 | Ht 75.0 in | Wt 282.2 lb

## 2024-03-28 DIAGNOSIS — G4709 Other insomnia: Secondary | ICD-10-CM

## 2024-03-28 DIAGNOSIS — F418 Other specified anxiety disorders: Secondary | ICD-10-CM

## 2024-03-28 DIAGNOSIS — I10 Essential (primary) hypertension: Secondary | ICD-10-CM | POA: Diagnosis not present

## 2024-03-28 DIAGNOSIS — R600 Localized edema: Secondary | ICD-10-CM | POA: Diagnosis not present

## 2024-03-28 MED ORDER — TRAZODONE HCL 50 MG PO TABS
25.0000 mg | ORAL_TABLET | Freq: Every evening | ORAL | 3 refills | Status: DC | PRN
Start: 2024-03-28 — End: 2024-04-21

## 2024-03-28 MED ORDER — SPIRONOLACTONE 25 MG PO TABS: 25.0000 mg | ORAL_TABLET | Freq: Every day | ORAL | 5 refills | Status: AC

## 2024-03-28 NOTE — Progress Notes (Signed)
 Established Patient Office Visit  Subjective   Patient ID: Matthew Daniels, male    DOB: Nov 09, 1985  Age: 38 y.o. MRN: [id number]  Chief Complaint  Patient presents with   Follow-up    Patient is here for a 2 week follow up for HTN    HPI  Hypertension/leg edema Pt hasn't taken Aldactone  25 mg daily a few weeks ago. He says it helped with his swelling and his HTN. He hasn't taken it in the last week.   Depression He says some days, he feels down and stuck. He feels like he doesn't want to get up. He says he thinks it's because he's dealt with a lot of things. He has insomnia and asks for something for sleep. He's tried OTC medicines that hasn't helped.  Flowsheet Row Office Visit from 03/28/2024 in [facility] Total Score 16     Review of Systems  Psychiatric/Behavioral:  Positive for depression. The patient has insomnia.   All other systems reviewed and are negative.     Objective:     BP (!) 143/94   Pulse 84   Temp 98 F (36.7 C) (Oral)   Resp 18   Ht 6' 3 (1.905 m)   Wt 282 lb 3.2 oz (128 kg)   SpO2 98%   BMI 35.27 kg/m     Physical Exam Vitals and nursing note reviewed.  Constitutional:      Appearance: Normal appearance. He is obese.  HENT:     Head: Normocephalic and atraumatic.     Right Ear: External ear normal.     Left Ear: External ear normal.     Nose: Nose normal.     Mouth/Throat:     Mouth: Mucous membranes are moist.     Pharynx: Oropharynx is clear.  Eyes:     Conjunctiva/sclera: Conjunctivae normal.     Pupils: Pupils are equal, round, and reactive to light.  Cardiovascular:     Rate and Rhythm: Normal rate.  Pulmonary:     Effort: Pulmonary effort is normal.  Skin:    General: Skin is warm.     Capillary Refill: Capillary refill takes less than 2 seconds.  Neurological:     General: No focal deficit present.     Mental Status: He is alert and oriented to person, place, and time. Mental status  is at baseline.  Psychiatric:        Mood and Affect: Mood normal.        Behavior: Behavior normal.        Thought Content: Thought content normal.        Judgment: Judgment normal.     No results found for any visits on 03/28/24.     The ASCVD Risk score (Arnett DK, et al., 2019) failed to calculate for the following reasons:   The 2019 ASCVD risk score is only valid for ages 62 to 57    Assessment & Plan:   Problem List Items Addressed This Visit   None Peripheral edema -     Spironolactone ; Take 1 tablet (25 mg total) by mouth daily.  Dispense: 30 tablet; Refill: 5 -     Basic metabolic panel with GFR  Primary hypertension -     Basic metabolic panel with GFR  Depression with anxiety -     Ambulatory referral to Behavioral Health  Other insomnia -     traZODone  HCl; Take 0.5-1 tablets (25-50 mg total)  by mouth at bedtime as needed for sleep.  Dispense: 30 tablet; Refill: 3   Pt with improved edema. To continue Aldactone  25mg  daily for HTN and edema. Recheck BMP. Down 9 lbs since starting Aldactone ,likely from fluid retention. For depression/anxiety, refer to counseling. Trial of Trazodone  25-50mg  at bedtime prn for insomnia.    No follow-ups on file.    Torrence CINDERELLA Barrier, MD

## 2024-03-29 LAB — BASIC METABOLIC PANEL WITH GFR
BUN/Creatinine Ratio: 9 (ref 9–20)
BUN: 10 mg/dL (ref 6–20)
CO2: 21 mmol/L (ref 20–29)
Calcium: 9.6 mg/dL (ref 8.7–10.2)
Chloride: 102 mmol/L (ref 96–106)
Creatinine, Ser: 1.13 mg/dL (ref 0.76–1.27)
Glucose: 95 mg/dL (ref 70–99)
Potassium: 4.4 mmol/L (ref 3.5–5.2)
Sodium: 138 mmol/L (ref 134–144)
eGFR: 85 mL/min/1.73 (ref >59)

## 2024-03-31 ENCOUNTER — Ambulatory Visit: Payer: Self-pay | Admitting: Family Medicine

## 2024-04-03 ENCOUNTER — Ambulatory Visit

## 2024-04-03 VITALS — BP 152/92 | Ht 75.0 in | Wt 282.0 lb

## 2024-04-03 DIAGNOSIS — M2142 Flat foot [pes planus] (acquired), left foot: Secondary | ICD-10-CM

## 2024-04-03 DIAGNOSIS — M2141 Flat foot [pes planus] (acquired), right foot: Secondary | ICD-10-CM

## 2024-04-03 DIAGNOSIS — M216X9 Other acquired deformities of unspecified foot: Secondary | ICD-10-CM

## 2024-04-04 NOTE — Progress Notes (Addendum)
 PCP: Colette Torrence GRADE, MD  Subjective:   HPI: Matthew Daniels is a 38 y.o. male here for fitting of custom orthotics. He was referred to us  by Dr. Curtis for bilateral ankle/foot pain.  He describes pain as along medial longitudinal arch as well as over his medial malleolus of his ankle.  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  Endorses having very flat feet for many years.  Previously underwent workup with Dr. ONEIDA which included negative x-rays on 03/04/2024.    Past Medical History:  Diagnosis Date   Migraine     Current Outpatient Medications on File Prior to Visit  Medication Sig Dispense Refill   butalbital -acetaminophen -caffeine  (FIORICET) 50-325-40 MG tablet TAKE 1 TABLET BY MOUTH EVERY 6 HOURS AS NEEDED FOR HEADACHE. 14 tablet 3   fluticasone  (FLONASE ) 50 MCG/ACT nasal spray Place 2 sprays into both nostrils daily. 18.2 mL 2   spironolactone  (ALDACTONE ) 25 MG tablet Take 1 tablet (25 mg total) by mouth daily. 30 tablet 5   SUMAtriptan  (IMITREX ) 50 MG tablet TAKE 1 TABLET EVERY 2 HOURS AS NEEDED FOR MIGRAINE. MAY REPEAT IN 2 HRS IF HEADACHE PERSISTS/RECURS 10 tablet 2   traZODone  (DESYREL ) 50 MG tablet Take 0.5-1 tablets (25-50 mg total) by mouth at bedtime as needed for sleep. 30 tablet 3   No current facility-administered medications on file prior to visit.    No past surgical history on file.  No Known Allergies  BP (!) 152/92   Ht 6' 3 (1.905 m)   Wt 282 lb (127.9 kg)   BMI 35.25 kg/m       No data to display              No data to display              Objective:  Physical Exam:  Gen: NAD, comfortable in exam room  Bilateral orthopedic foot exam: On inspection Matthew Daniels has no evidence of acute erythema, ecchymoses or generalized edema.  Matthew Daniels has mild TTP over medial malleolus, navicular at insertion of posterior tib, and along longitudinal arch.  Matthew Daniels has full active and passive range of motion including  dorsiflexion, plantarflexion, inversion and eversion.  Strength 5 out of 5 bilaterally.  He does have severe collapse of both his transverse and longitudinal arches bilaterally.  On witnessed plantar foot and she does have weakness of his posterior tibialis musculature.  He has severe pes planus bilaterally.  He also has too many toes sign bilaterally.  Neurovascularly intact.   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 ONEIDA Matthew Daniels was referred to us  for fitting of custom orthotics due to chronic bilateral feet and ankle pain. Matthew Daniels was fitted for a : Fit 'n Run semi-rigid orthotic  The orthotic was heated, placed on the orthotic stand. The Matthew Daniels was positioned in subtalar neutral position and 10 degrees of ankle dorsiflexion in a weight bearing stance on the heated orthotic blank After completion of molding Blank: Fit 'n Run - size 13 Posting: Added small scaphoid pads bilaterally.  Given this is new to him it may be a transition from small scaphoid pad up to large scaphoid pad which I believe he will ultimately need. Base:  none - Matthew Daniels endorsed increased comfort after fitting of custom orthotics with addition of scaphoid pad.

## 2024-04-08 ENCOUNTER — Encounter: Payer: Self-pay | Admitting: Sports Medicine

## 2024-04-15 ENCOUNTER — Ambulatory Visit: Admitting: Sports Medicine

## 2024-04-21 ENCOUNTER — Other Ambulatory Visit: Payer: Self-pay | Admitting: Family Medicine

## 2024-04-21 DIAGNOSIS — G4709 Other insomnia: Secondary | ICD-10-CM

## 2024-06-16 ENCOUNTER — Ambulatory Visit: Payer: Self-pay

## 2024-06-16 NOTE — Telephone Encounter (Signed)
 Pt is having a flare up of arthritis and it's hard to move

## 2024-06-16 NOTE — Telephone Encounter (Signed)
 FYI Only or Action Required?: FYI only for provider: ED advised.  Patient was last seen in primary care on 03/28/2024 by Colette Torrence GRADE, MD.  Called Nurse Triage reporting Arthritis.  Symptoms began several weeks ago.  Interventions attempted: OTC medications: ibuprofen .  Symptoms are: gradually worsening.  Triage Disposition: Go to ED Now (Notify PCP)  Patient/caregiver understands and will follow disposition?: Yes  Message from [facility] sent at 06/16/2024 12:10 PM EST  Pt is having a flare up of arthritis and it's hard to move   Reason for Disposition  [1] Chest pain (or angina) comes and goes AND [2] is happening more often (increasing in frequency) or getting worse (increasing in severity)  (Exception: Chest pains that last only a few seconds.)  Answer Assessment - Initial Assessment Questions Pt calling with increased joint pain, worse in the mornings. Ankles, elbows, back. Feels like an 38yo man when he wakes because he is barely able to walk with stiffness. Patient states his chest even hurts when he bends down. Chest pain described as achy, sharp, to the left chest 8-9/10 pain. Lasts a few seconds with no radiating. The pain is sharp so he has to catch his breath but denies SOB. Going on for a month and a half intermittently. Agrees to go to the ED for chest pain evaluation. Appt scheduled for joint pain evaluation as he was told at one point he might have RA. Ibuprofen  no benefit He will call back s/p chest pain eval for HFU as well.   When bending over to pick something up it hurts on the left side of his chest  Achy and sharp, pulling pain 1. LOCATION: Where does it hurt?       Left chest 2. RADIATION: Does the pain go anywhere else? (e.g., into neck, jaw, arms, back)     denies 3. ONSET: When did the chest pain begin? (Minutes, hours or days)      Intermittent for a month and a half  4. PATTERN: Does the pain come and go, or has it been constant since it  started?  Does it get worse with exertion?      Bends over it hurts and when straightens it goes away  5. DURATION: How long does it last (e.g., seconds, minutes, hours)     Only while bending over 6. SEVERITY: How bad is the pain?  (e.g., Scale 1-10; mild, moderate, or severe)     Aching/sharp 8-9/10  7. CARDIAC RISK FACTORS: Do you have any history of heart problems or risk factors for heart disease? (e.g., angina, prior heart attack; diabetes, high blood pressure, high cholesterol, smoker, or strong family history of heart disease)     Denies 8. PULMONARY RISK FACTORS: Do you have any history of lung disease?  (e.g., blood clots in lung, asthma, emphysema, birth control pills)     Denies  9. CAUSE: What do you think is causing the chest pain?     Unsure if related to his joint pain.  10. OTHER SYMPTOMS: Do you have any other symptoms? (e.g., dizziness, nausea, vomiting, sweating, fever, difficulty breathing, cough)       Joint stiffness  Protocols used: Chest Pain-A-AH

## 2024-06-16 NOTE — Telephone Encounter (Signed)
 The patient has an upcoming appointment scheduled with the provider on 07/01/24.

## 2024-06-17 ENCOUNTER — Encounter: Payer: Self-pay | Admitting: Behavioral Health

## 2024-06-17 ENCOUNTER — Ambulatory Visit: Admitting: Behavioral Health

## 2024-06-17 DIAGNOSIS — F321 Major depressive disorder, single episode, moderate: Secondary | ICD-10-CM

## 2024-06-17 NOTE — Progress Notes (Signed)
   Matthew Daniels, Matthew Daniels
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
## 2024-06-19 ENCOUNTER — Encounter: Payer: Self-pay | Admitting: Family Medicine

## 2024-06-19 ENCOUNTER — Ambulatory Visit

## 2024-06-19 ENCOUNTER — Ambulatory Visit: Payer: Self-pay | Admitting: Family Medicine

## 2024-06-19 ENCOUNTER — Ambulatory Visit: Admitting: Family Medicine

## 2024-06-19 VITALS — BP 138/82 | HR 86 | Temp 98.0°F | Ht 75.0 in | Wt 273.0 lb

## 2024-06-19 DIAGNOSIS — M2550 Pain in unspecified joint: Secondary | ICD-10-CM | POA: Insufficient documentation

## 2024-06-19 DIAGNOSIS — R0789 Other chest pain: Secondary | ICD-10-CM

## 2024-06-19 NOTE — Patient Instructions (Signed)
 [facility]  [address]  The radiology department is on the first floor which is best accessed by going around to the back of the building. No appointment necessary. You can go at your convenience.

## 2024-06-19 NOTE — Progress Notes (Signed)
   Established Patient Office Visit  Subjective   Patient ID: Matthew Daniels, male    DOB: 1985/10/15  Age: 38 y.o. MRN: [id number]  Chief Complaint  Patient presents with   Joint Swelling    Swelling and stiffness in shoulders, elbows, knees and ankles.    Was going to PT for his knee. Has multiple joints pain daily.  Sports med provider told him to get checked out for autoimmune.  Reports pain in ankles, elbow, and rear deltoid. Pain is worse in the morning after sleeping or sitting. Has to loosens joints up to get going.  Stiffness when driving car. Symptoms present for 4 months, getting worse. Ankles and joints in hands swell.  Ibuprofen  not helping.  Dull ache in center of chest, worse with bending. Points to area of discomfort.  No chest pain or shortness of breath with exertion. Will get chest x-ray today.  Discussed low suspicion of cardiac involvement with reported symptoms.  Will get labs today to rule out autoimmune.  Provider observed him moving slowly to stand up from chair when going to the lab.  Discussed importance of exercise to keep joints flexible. Eats healthy diet, low in sodium.  Follow-up based on results of labs.         ROS    Objective:     BP 138/82 (BP Location: Left Arm, Patient Position: Sitting, Cuff Size: Large)   Pulse 86   Temp 98 F (36.7 C) (Oral)   Ht 6' 3 (1.905 m)   Wt 273 lb (123.8 kg)   SpO2 100%   BMI 34.12 kg/m    Physical Exam Vitals and nursing note reviewed.  Constitutional:      General: He is not in acute distress.    Appearance: Normal appearance.  Cardiovascular:     Rate and Rhythm: Normal rate and regular rhythm.     Heart sounds: Normal heart sounds.  Pulmonary:     Effort: Pulmonary effort is normal.     Breath sounds: Normal breath sounds.  Skin:    General: Skin is warm and dry.  Neurological:     General: No focal deficit present.     Mental Status: He is alert. Mental status is at baseline.   Psychiatric:        Mood and Affect: Mood normal.        Behavior: Behavior normal.        Thought Content: Thought content normal.        Judgment: Judgment normal.      No results found for any visits on 06/19/24.    The ASCVD Risk score (Arnett DK, et al., 2019) failed to calculate for the following reasons:   The 2019 ASCVD risk score is only valid for ages 79 to 60    Assessment & Plan:   Problem List Items Addressed This Visit     Generalized joint pain - Primary   Relevant Orders   CBC   ANA   Rheumatoid factor   Sed Rate (ESR)   Chest wall discomfort   Relevant Orders   DG Chest 2 View  Agrees with plan of care discussed.  Questions answered.   Return if symptoms worsen or fail to improve.    Darice JONELLE Brownie, FNP

## 2024-06-21 LAB — ANA: ANA Titer 1: NEGATIVE

## 2024-06-21 LAB — CBC
Hematocrit: 42.3 % (ref 37.5–51.0)
Hemoglobin: 13.3 g/dL (ref 13.0–17.7)
MCH: 25.3 pg — ABNORMAL LOW (ref 26.6–33.0)
MCHC: 31.4 g/dL — ABNORMAL LOW (ref 31.5–35.7)
MCV: 81 fL (ref 79–97)
Platelets: 271 x10E3/uL (ref 150–450)
RBC: 5.25 x10E6/uL (ref 4.14–5.80)
RDW: 14.5 % (ref 11.6–15.4)
WBC: 5.5 x10E3/uL (ref 3.4–10.8)

## 2024-06-21 LAB — RHEUMATOID FACTOR: Rheumatoid fact SerPl-aCnc: 10 [IU]/mL (ref <14.0)

## 2024-06-21 LAB — SEDIMENTATION RATE: Sed Rate: 34 mm/h — ABNORMAL HIGH (ref 0–15)

## 2024-06-24 ENCOUNTER — Telehealth: Payer: Self-pay

## 2024-06-24 NOTE — Telephone Encounter (Signed)
 please call this pt and schedule follow-up from last visit, thank you, Matthew Daniels (I also sent him a note telling him that you would be calling) He can have the 1050 slot if needed- try to avoid the 4:10 since we will need labs at the follow-up visit, ty, Matthew Daniels

## 2024-06-24 NOTE — Telephone Encounter (Signed)
 Called patient, no answer, Left voicemail for the patient to call back to schedule a follow up appointment from the last visit.

## 2024-06-25 ENCOUNTER — Ambulatory Visit: Payer: Self-pay

## 2024-06-25 NOTE — Telephone Encounter (Signed)
 Second attempt to reach patient, LVM for patient to return call to [phone].  Call routed to office for follow up  Copied from CRM #[id number]. Topic: Clinical - Red Word Triage >> Jun 25, 2024  9:32 AM Tinnie BROCKS wrote: Red Word that prompted transfer to Nurse Triage: Pt received call from office to schedule a follow up visit. I asked if he had any new/worsening symptoms, he states there is a worsening swelling in his joints, hard to walk.

## 2024-06-25 NOTE — Telephone Encounter (Signed)
 This RN made first attempt to contact pt with no answer.  A vm was left with call back number provided.    Copied from CRM [id number]. Topic: Clinical - Red Word Triage >> Jun 25, 2024  9:32 AM Tinnie BROCKS wrote: Red Word that prompted transfer to Nurse Triage: Pt received call from office to schedule a follow up visit. I asked if he had any new/worsening symptoms, he states there is a worsening swelling in his joints, hard to walk.

## 2024-06-26 ENCOUNTER — Ambulatory Visit: Admitting: Family Medicine

## 2024-06-26 ENCOUNTER — Encounter: Payer: Self-pay | Admitting: Family Medicine

## 2024-06-26 ENCOUNTER — Other Ambulatory Visit: Payer: Self-pay | Admitting: Family Medicine

## 2024-06-26 VITALS — BP 132/90 | HR 82 | Temp 98.0°F | Ht 75.0 in | Wt 272.1 lb

## 2024-06-26 DIAGNOSIS — G43709 Chronic migraine without aura, not intractable, without status migrainosus: Secondary | ICD-10-CM

## 2024-06-26 DIAGNOSIS — M2550 Pain in unspecified joint: Secondary | ICD-10-CM | POA: Diagnosis not present

## 2024-06-26 DIAGNOSIS — D509 Iron deficiency anemia, unspecified: Secondary | ICD-10-CM | POA: Diagnosis not present

## 2024-06-26 DIAGNOSIS — R600 Localized edema: Secondary | ICD-10-CM | POA: Insufficient documentation

## 2024-06-26 MED ORDER — MELOXICAM 15 MG PO TABS: 15.0000 mg | ORAL_TABLET | Freq: Every day | ORAL | 0 refills | Status: AC

## 2024-06-26 MED ORDER — DULOXETINE HCL 30 MG PO CPEP: ORAL_CAPSULE | ORAL | 1 refills | Status: DC

## 2024-06-26 NOTE — Progress Notes (Signed)
   Established Patient Office Visit  Subjective   Patient ID: Matthew Daniels, male    DOB: Aug 18, 1985  Age: 38 y.o. MRN: [id number]  Chief Complaint  Patient presents with   Medical Management of Chronic Issues    Presents for follow-up on wide spread joint pain.  Symptoms are worse making it difficulty to walk. Wore knee brace yesterday, did not help symptoms.  Obvious joint pain and mobility issues witnessed in exam room, need to hold on to the table just to stand.   Provider consulted with Dr. Alvan on next steps in care.  Labs: Iron study to confirm iron deficiency, Anti-CCP in setting of negative RF and ANA.  Cymbalta 30 mg for next 7 days then increase to twice per day to equal 60 mg daily.  Endorses depression, cymbalta will help with depression and hopefully his joint pain. Meloxicam 15 mg daily with food for next 3 weeks.  Will hold trazodone  for now. Plan: Try Meloxicam and Cymbalta daily as instructed.  Follow-up in 3 weeks. Work note provided today.   Elevated blood pressure: has not taken medications today. Will take as soon as possible when he gets home.         ROS    Objective:     BP (!) 132/90 (Patient Position: Sitting, Cuff Size: Large)   Pulse 82   Temp 98 F (36.7 C) (Oral)   Ht 6' 3 (1.905 m)   Wt 272 lb 1.6 oz (123.4 kg)   SpO2 100%   BMI 34.01 kg/m  BP Readings from Last 3 Encounters:  06/26/24 (!) 132/90  06/19/24 138/82  04/03/24 (!) 152/92      Physical Exam Vitals and nursing note reviewed.  Constitutional:      General: He is not in acute distress.    Appearance: Normal appearance.  Cardiovascular:     Rate and Rhythm: Normal rate.  Pulmonary:     Effort: Pulmonary effort is normal.  Musculoskeletal:        General: Swelling present.  Skin:    General: Skin is warm and dry.  Neurological:     General: No focal deficit present.     Mental Status: He is alert. Mental status is at baseline.  Psychiatric:         Mood and Affect: Mood normal.        Behavior: Behavior normal.        Thought Content: Thought content normal.        Judgment: Judgment normal.      No results found for any visits on 06/26/24.    The ASCVD Risk score (Arnett DK, et al., 2019) failed to calculate for the following reasons:   The 2019 ASCVD risk score is only valid for ages 75 to 33    Assessment & Plan:   Problem List Items Addressed This Visit     Generalized joint pain - Primary   Relevant Medications   meloxicam (MOBIC) 15 MG tablet   DULoxetine (CYMBALTA) 30 MG capsule   Other Relevant Orders   Anti-CCP Ab, IgG + IgA (RDL)   Iron deficiency anemia   Relevant Orders   Iron, TIBC and Ferritin Panel  Agrees with plan of care discussed.  Questions answered.   Return in about 3 weeks (around 07/17/2024) for joint pain .    Darice JONELLE Brownie, FNP

## 2024-07-01 ENCOUNTER — Ambulatory Visit: Admitting: Family Medicine

## 2024-07-07 ENCOUNTER — Ambulatory Visit: Admitting: Behavioral Health

## 2024-07-09 ENCOUNTER — Encounter: Payer: Self-pay | Admitting: Behavioral Health

## 2024-07-09 ENCOUNTER — Ambulatory Visit: Admitting: Behavioral Health

## 2024-07-09 DIAGNOSIS — F321 Major depressive disorder, single episode, moderate: Secondary | ICD-10-CM

## 2024-07-09 NOTE — Progress Notes (Signed)
   [place], [facility]

## 2024-07-09 NOTE — Progress Notes (Signed)
 [facility] Counselor/Therapist Progress Note  Patient ID: Matthew Daniels, MRN: [id number],    Date: 07/09/2024  Time Spent: 9 AM until 9:55 AM, 55 minutes spent via care agility telehealth visit.  The patient is aware of the limitations of the telehealth video visit but did consent to the visit.  The patient was in his home and this therapist was in his home outpatient therapist office.  Treatment Type: Individual Therapy  Reported Symptoms: Anxiety, depression  Mental Status Exam: Appearance:  Casual     Behavior: Appropriate  Motor: Normal  Speech/Language:  Clear and Coherent  Affect: Appropriate  Mood: normal  Thought process: normal  Thought content:   WNL  Sensory/Perceptual disturbances:   WNL  Orientation: oriented to person, place, time/date, situation, day of week, month of year, and year  Attention: Good  Concentration: Good  Memory: WNL  Fund of knowledge:  Good  Insight:   Good  Judgment:  Good  Impulse Control: Good   Risk Assessment: Danger to Self:  No Self-injurious Behavior: No Danger to Others: No Duty to Warn:no Physical Aggression / Violence:No  Access to Firearms a concern: No  Gang Involvement:No   Subjective: Reviewed the initial session to develop the treatment plan with the patient.  Formal treatment plan is in the body of this note.  We looked more at the patient's relationship including with his mother.  His mother has progressing dementia and he says that at least for short-term memory loss and also some irritability.  There are times she does not sleep well which has an impact on him at times because he works second shift.  He is working on his relationship with his 40 year old son but says he is pretty quiet and will refer him to therapy because he was with his biological mom a lot prior to the former year.  He reports a good relationship with the mother of his 3 other children and a good relationship with him.  So far his job is  okay.  He did start medication and says he has seen some slight elevation in mood in the past week and a half and is thankful for that.  He says that he is a little less irritable and does not have as many dark thoughts.  He does contract for safety.  We began to process some of his past but also look at coping skills to help alleviate depression as he has time and financial resources to do.  Interventions: Cognitive Behavioral Therapy  Diagnosis:MDD recurrent, moderate  Plan: I will meet with the patient every 2 weeks via care agility or in office.  Treatment plan:Behavioral Health Treatment Plan   Name:[facility]   Not on file      MRN: [id number]   Treatment Plan Development Date: 07/09/24   Strengths: Family, Hopefulness, and Able to Communicate Effectively  Supports: Significant Other   Client Statement of Needs: Patient reports a history of depression which has been worse over the past couple of years.  He recently started medication which has stabilized mood slightly initially but is to continue to work on mood stability and depression reduction.   Treatment Level: Patient would prefer a mix of in office and virtual visit based on his work schedule.  He agrees to meet every other week.   Diagnosis Depressive Disorders  Major depressive disorder, recurrent, moderate  Symptoms:  Loss of interest or pleasure in almost all activities-indicated by subjective report or observation by others.,  Tiredness, fatigue, or low energy, or decreased efficiency with which routine tasks are completed., and A sense of worthlessness or excessive, inappropriate, or delusional guilt (not merely self-reproach or guilt about being sick).  Goals:  Alleviate depressive symptoms to return to effective functioning., Recognize, accept, and cope with depressed feelings., Develop healthy thinking and beliefs about self, others, and the world to alleviate and prevent relapse.,  and Establish healthy relationships that alleviate and prevent relapse.  Objectives: Target Date For All Objectives: July 09, 2025  Describe experiences with depression including impact on functioning and attempts to resolve., Openly discuss suicidal thoughts and actions and develop alternative strategies to manage., Openly discuss thoughts of self-harm and establish alternative coping skills., Cooperate with a medication evaluation by physician and follow recommendations., Verbalize an accurate understanding of depression., and Identify and replace thoughts and beliefs that support depression.  Progress Documentation:  Progressing  Interventions:  Cognitive Behavioral Therapy and Dialectical Behavioral Therapy   Expected duration of treatment: 1 year  Party responsible for implementation of interventions: Patient and therapist.   This plan has been reviewed and created by the following participants: Patient and therapist  A new plan will be created at least every 12 months.  The patient fully participated in the development of treatment plan with the clinician and verbally consents to such treatment.   Patient Treatment Plan Signature Obtained: No, pending signature via MyChart.   Lorrene CHRISTELLA Hasten, [facility]   Lorrene CHRISTELLA Hasten, [facility]

## 2024-07-10 ENCOUNTER — Ambulatory Visit: Payer: Self-pay | Admitting: Family Medicine

## 2024-07-10 LAB — IRON,TIBC AND FERRITIN PANEL
Ferritin: 241 ng/mL (ref 30–400)
Iron Saturation: 16 % (ref 15–55)
Iron: 44 ug/dL (ref 38–169)
Total Iron Binding Capacity: 274 ug/dL (ref 250–450)
UIBC: 230 ug/dL (ref 111–343)

## 2024-07-10 LAB — ANTI-CCP AB, IGG + IGA (RDL): Anti-CCP Ab, IgG + IgA (RDL): <20 U (ref <20)

## 2024-07-17 ENCOUNTER — Encounter: Payer: Self-pay | Admitting: Family Medicine

## 2024-07-17 ENCOUNTER — Ambulatory Visit: Admitting: Family Medicine

## 2024-07-17 DIAGNOSIS — M2550 Pain in unspecified joint: Secondary | ICD-10-CM | POA: Diagnosis not present

## 2024-07-17 MED ORDER — DULOXETINE HCL 60 MG PO CPEP: 60.0000 mg | ORAL_CAPSULE | Freq: Every day | ORAL | 1 refills | Status: AC

## 2024-07-17 NOTE — Assessment & Plan Note (Addendum)
 Cymbalta  60 mg daily has improved joint pain significantly. Describes stiffness, worse in the morning. Observed him standing and walking much better than before. Encouraged to stay active and move as much as possible.  Mood also improved.  Continue Cymbalta  60 mg daily. Okay to finish out meloxicam  15 mg daily. Follow-up in 3 months with PCP.

## 2024-07-17 NOTE — Progress Notes (Signed)
° °  Established Patient Office Visit  Subjective   Patient ID: Matthew Daniels, male    DOB: 12-17-1985  Age: 38 y.o. MRN: [id number]  Chief Complaint  Patient presents with   Joint pain    3 week follow up    Taking Cymbalta  30 mg for 7 days, then 60 mg daily. Symptoms have improved since starting Cymbalta  and meloxicam  15 mg daily. His mood has also improved with Cymbalta . His joint pain resolved, now describes right knee stiffness and both ankle stiffness. Right hand stiffness in the morning.  Standing up and walking has improved. Will continue Cymbalta  60 mg daily and he will finish out the meloxicam  prescription. Holding trazodone  currently.  Follow-up with PCP in 3 months.        ROS    Objective:     BP 132/82 (BP Location: Left Arm, Patient Position: Sitting, Cuff Size: Large)   Pulse 90   Temp 97.9 F (36.6 C) (Oral)   Ht 6' 3 (1.905 m)   Wt 271 lb (122.9 kg)   SpO2 95%   BMI 33.87 kg/m    Physical Exam Vitals and nursing note reviewed.  Constitutional:      Appearance: Normal appearance.  Cardiovascular:     Rate and Rhythm: Regular rhythm.     Heart sounds: Normal heart sounds.  Pulmonary:     Effort: Pulmonary effort is normal.     Breath sounds: Normal breath sounds.  Skin:    General: Skin is warm and dry.  Neurological:     General: No focal deficit present.     Mental Status: He is alert. Mental status is at baseline.  Psychiatric:        Mood and Affect: Mood normal.        Behavior: Behavior normal.        Thought Content: Thought content normal.        Judgment: Judgment normal.      No results found for any visits on 07/17/24.    The ASCVD Risk score (Arnett DK, et al., 2019) failed to calculate for the following reasons:   The 2019 ASCVD risk score is only valid for ages 72 to 53    Assessment & Plan:   Problem List Items Addressed This Visit     Generalized joint pain - Primary   Cymbalta  60 mg daily has improved joint  pain significantly. Describes stiffness, worse in the morning. Observed him standing and walking much better than before. Encouraged to stay active and move as much as possible.  Mood also improved.  Continue Cymbalta  60 mg daily. Okay to finish out meloxicam  15 mg daily. Follow-up in 3 months with PCP.        Relevant Medications   DULoxetine  (CYMBALTA ) 60 MG capsule  Agrees with plan of care discussed.  Questions answered.   Return in about 3 months (around 10/15/2024) for joint pain/chronic conditions .    Darice JONELLE Brownie, FNP

## 2024-08-14 ENCOUNTER — Ambulatory Visit: Admitting: Behavioral Health

## 2024-08-26 ENCOUNTER — Ambulatory Visit: Admitting: Behavioral Health

## 2024-09-29 ENCOUNTER — Ambulatory Visit: Admitting: Family Medicine

## 2024-10-15 ENCOUNTER — Ambulatory Visit: Admitting: Family Medicine
